# Patient Record
Sex: Male | Born: 1935 | Race: White | Hispanic: No | Marital: Married | Smoking: Never smoker
Health system: Southern US, Community
[De-identification: ages and names within clinical notes are randomized; demographics above are authoritative.]

## PROBLEM LIST (undated history)

## (undated) DIAGNOSIS — F0390 Unspecified dementia without behavioral disturbance: Secondary | ICD-10-CM

## (undated) DIAGNOSIS — E538 Deficiency of other specified B group vitamins: Secondary | ICD-10-CM

## (undated) DIAGNOSIS — E669 Obesity, unspecified: Secondary | ICD-10-CM

## (undated) DIAGNOSIS — F329 Major depressive disorder, single episode, unspecified: Secondary | ICD-10-CM

## (undated) DIAGNOSIS — I10 Essential (primary) hypertension: Secondary | ICD-10-CM

## (undated) DIAGNOSIS — I2510 Atherosclerotic heart disease of native coronary artery without angina pectoris: Secondary | ICD-10-CM

## (undated) DIAGNOSIS — I219 Acute myocardial infarction, unspecified: Secondary | ICD-10-CM

## (undated) DIAGNOSIS — E785 Hyperlipidemia, unspecified: Secondary | ICD-10-CM

## (undated) DIAGNOSIS — I2589 Other forms of chronic ischemic heart disease: Secondary | ICD-10-CM

## (undated) DIAGNOSIS — F32A Depression, unspecified: Secondary | ICD-10-CM

## (undated) HISTORY — DX: Hyperlipidemia, unspecified: E78.5

## (undated) HISTORY — DX: Deficiency of other specified B group vitamins: E53.8

## (undated) HISTORY — DX: Acute myocardial infarction, unspecified: I21.9

## (undated) HISTORY — DX: Obesity, unspecified: E66.9

## (undated) HISTORY — DX: Atherosclerotic heart disease of native coronary artery without angina pectoris: I25.10

## (undated) HISTORY — PX: FRACTURE SURGERY: SHX138

## (undated) HISTORY — DX: Essential (primary) hypertension: I10

## (undated) HISTORY — DX: Other forms of chronic ischemic heart disease: I25.89

## (undated) HISTORY — PX: CHOLECYSTECTOMY: SHX55

---
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
## 2003-09-03 ENCOUNTER — Inpatient Hospital Stay (HOSPITAL_COMMUNITY): Admission: RE | Admit: 2003-09-03 | Discharge: 2003-09-08 | Payer: Self-pay

## 2003-09-03 ENCOUNTER — Encounter (INDEPENDENT_AMBULATORY_CARE_PROVIDER_SITE_OTHER): Payer: Self-pay | Admitting: *Deleted

## 2003-09-11 ENCOUNTER — Encounter: Admission: RE | Admit: 2003-09-11 | Discharge: 2003-09-11 | Payer: Self-pay

## 2005-02-28 DIAGNOSIS — I219 Acute myocardial infarction, unspecified: Secondary | ICD-10-CM

## 2005-02-28 DIAGNOSIS — I2510 Atherosclerotic heart disease of native coronary artery without angina pectoris: Secondary | ICD-10-CM

## 2005-02-28 HISTORY — DX: Atherosclerotic heart disease of native coronary artery without angina pectoris: I25.10

## 2005-02-28 HISTORY — PX: CARDIAC CATHETERIZATION: SHX172

## 2005-02-28 HISTORY — PX: CORONARY ARTERY BYPASS GRAFT: SHX141

## 2005-02-28 HISTORY — DX: Acute myocardial infarction, unspecified: I21.9

## 2005-03-25 ENCOUNTER — Inpatient Hospital Stay (HOSPITAL_COMMUNITY): Admission: EM | Admit: 2005-03-25 | Discharge: 2005-04-05 | Payer: Self-pay | Admitting: Emergency Medicine

## 2005-03-25 ENCOUNTER — Ambulatory Visit: Payer: Self-pay | Admitting: Internal Medicine

## 2005-03-28 ENCOUNTER — Encounter: Payer: Self-pay | Admitting: Cardiology

## 2005-04-07 ENCOUNTER — Ambulatory Visit: Payer: Self-pay | Admitting: *Deleted

## 2005-04-14 ENCOUNTER — Ambulatory Visit: Payer: Self-pay | Admitting: Cardiology

## 2005-04-19 ENCOUNTER — Ambulatory Visit: Payer: Self-pay | Admitting: Cardiovascular Disease

## 2005-04-21 ENCOUNTER — Ambulatory Visit: Payer: Self-pay | Admitting: Cardiology

## 2005-04-28 ENCOUNTER — Ambulatory Visit: Payer: Self-pay | Admitting: Cardiology

## 2005-05-04 ENCOUNTER — Ambulatory Visit: Payer: Self-pay | Admitting: Internal Medicine

## 2005-05-05 ENCOUNTER — Ambulatory Visit: Payer: Self-pay | Admitting: Cardiology

## 2005-05-13 ENCOUNTER — Ambulatory Visit: Payer: Self-pay | Admitting: Cardiovascular Disease

## 2005-05-23 ENCOUNTER — Ambulatory Visit: Payer: Self-pay | Admitting: Internal Medicine

## 2005-05-27 ENCOUNTER — Ambulatory Visit: Payer: Self-pay | Admitting: Cardiology

## 2005-05-31 ENCOUNTER — Ambulatory Visit: Payer: Self-pay | Admitting: Cardiology

## 2005-06-08 ENCOUNTER — Ambulatory Visit: Payer: Self-pay | Admitting: Internal Medicine

## 2005-06-08 ENCOUNTER — Ambulatory Visit: Payer: Self-pay | Admitting: Cardiology

## 2005-06-08 ENCOUNTER — Ambulatory Visit: Payer: Self-pay | Admitting: *Deleted

## 2005-06-15 ENCOUNTER — Ambulatory Visit: Payer: Self-pay | Admitting: *Deleted

## 2005-06-17 ENCOUNTER — Ambulatory Visit: Payer: Self-pay

## 2005-06-17 ENCOUNTER — Encounter: Payer: Self-pay | Admitting: Internal Medicine

## 2005-06-29 ENCOUNTER — Ambulatory Visit: Payer: Self-pay | Admitting: Internal Medicine

## 2005-06-29 ENCOUNTER — Ambulatory Visit: Payer: Self-pay | Admitting: Cardiology

## 2005-07-11 ENCOUNTER — Ambulatory Visit: Payer: Self-pay | Admitting: Internal Medicine

## 2005-07-13 ENCOUNTER — Ambulatory Visit: Payer: Self-pay | Admitting: Cardiology

## 2005-08-01 ENCOUNTER — Ambulatory Visit: Payer: Self-pay | Admitting: Cardiology

## 2005-08-01 ENCOUNTER — Ambulatory Visit: Payer: Self-pay | Admitting: Internal Medicine

## 2005-08-29 ENCOUNTER — Ambulatory Visit: Payer: Self-pay | Admitting: Cardiology

## 2005-09-12 ENCOUNTER — Ambulatory Visit: Payer: Self-pay | Admitting: Internal Medicine

## 2005-09-15 ENCOUNTER — Ambulatory Visit: Payer: Self-pay | Admitting: Cardiology

## 2005-09-26 ENCOUNTER — Ambulatory Visit: Payer: Self-pay | Admitting: Cardiology

## 2005-10-19 ENCOUNTER — Encounter: Payer: Self-pay | Admitting: Cardiology

## 2005-10-19 ENCOUNTER — Ambulatory Visit: Payer: Self-pay

## 2005-10-19 ENCOUNTER — Ambulatory Visit: Payer: Self-pay | Admitting: Cardiology

## 2005-12-14 ENCOUNTER — Ambulatory Visit: Payer: Self-pay | Admitting: Internal Medicine

## 2006-03-16 ENCOUNTER — Ambulatory Visit: Payer: Self-pay | Admitting: Internal Medicine

## 2006-03-17 ENCOUNTER — Ambulatory Visit: Payer: Self-pay | Admitting: Internal Medicine

## 2006-05-25 ENCOUNTER — Ambulatory Visit: Payer: Self-pay | Admitting: Cardiology

## 2006-07-05 ENCOUNTER — Ambulatory Visit: Payer: Self-pay | Admitting: Cardiology

## 2006-07-05 LAB — CONVERTED CEMR LAB
ALT: 16 units/L (ref 0–40)
AST: 21 units/L (ref 0–37)
Albumin: 3.5 g/dL (ref 3.5–5.2)
Alkaline Phosphatase: 76 units/L (ref 39–117)
Bilirubin, Direct: 0.1 mg/dL (ref 0.0–0.3)
Cholesterol: 131 mg/dL (ref 0–200)
HDL: 37 mg/dL — ABNORMAL LOW (ref >39.0)
LDL Cholesterol: 73 mg/dL (ref 0–99)
Total Bilirubin: 0.7 mg/dL (ref 0.3–1.2)
Total CHOL/HDL Ratio: 3.5
Total Protein: 6.8 g/dL (ref 6.0–8.3)
Triglycerides: 104 mg/dL (ref 0–149)
VLDL: 21 mg/dL (ref 0–40)

## 2007-01-04 ENCOUNTER — Encounter: Payer: Self-pay | Admitting: Internal Medicine

## 2007-01-04 ENCOUNTER — Ambulatory Visit: Payer: Self-pay | Admitting: Cardiology

## 2007-01-13 ENCOUNTER — Encounter: Payer: Self-pay | Admitting: *Deleted

## 2007-01-13 DIAGNOSIS — I10 Essential (primary) hypertension: Secondary | ICD-10-CM

## 2007-01-13 DIAGNOSIS — I219 Acute myocardial infarction, unspecified: Secondary | ICD-10-CM | POA: Insufficient documentation

## 2007-01-13 DIAGNOSIS — Z951 Presence of aortocoronary bypass graft: Secondary | ICD-10-CM

## 2007-01-13 DIAGNOSIS — Z9089 Acquired absence of other organs: Secondary | ICD-10-CM

## 2007-01-13 DIAGNOSIS — I2510 Atherosclerotic heart disease of native coronary artery without angina pectoris: Secondary | ICD-10-CM | POA: Insufficient documentation

## 2007-01-13 DIAGNOSIS — I2589 Other forms of chronic ischemic heart disease: Secondary | ICD-10-CM | POA: Insufficient documentation

## 2007-01-13 DIAGNOSIS — Z9861 Coronary angioplasty status: Secondary | ICD-10-CM

## 2007-01-13 DIAGNOSIS — E785 Hyperlipidemia, unspecified: Secondary | ICD-10-CM | POA: Insufficient documentation

## 2007-03-15 ENCOUNTER — Encounter: Payer: Self-pay | Admitting: Internal Medicine

## 2007-03-21 ENCOUNTER — Encounter: Payer: Self-pay | Admitting: Internal Medicine

## 2007-06-27 ENCOUNTER — Ambulatory Visit: Payer: Self-pay | Admitting: Internal Medicine

## 2007-06-27 ENCOUNTER — Encounter (INDEPENDENT_AMBULATORY_CARE_PROVIDER_SITE_OTHER): Payer: Self-pay | Admitting: *Deleted

## 2007-06-27 DIAGNOSIS — E669 Obesity, unspecified: Secondary | ICD-10-CM

## 2007-07-02 ENCOUNTER — Ambulatory Visit: Payer: Self-pay | Admitting: Internal Medicine

## 2007-07-02 LAB — CONVERTED CEMR LAB
ALT: 12 units/L (ref 0–53)
AST: 22 units/L (ref 0–37)
Albumin: 3.6 g/dL (ref 3.5–5.2)
Alkaline Phosphatase: 70 units/L (ref 39–117)
BUN: 13 mg/dL (ref 6–23)
Basophils Absolute: 0.1 10*3/uL (ref 0.0–0.1)
Basophils Relative: 1 % (ref 0.0–1.0)
Bilirubin, Direct: 0.1 mg/dL (ref 0.0–0.3)
CO2: 31 meq/L (ref 19–32)
Calcium: 8.6 mg/dL (ref 8.4–10.5)
Chloride: 108 meq/L (ref 96–112)
Cholesterol: 156 mg/dL (ref 0–200)
Creatinine, Ser: 1 mg/dL (ref 0.4–1.5)
Eosinophils Absolute: 0.2 10*3/uL (ref 0.0–0.7)
Eosinophils Relative: 2.7 % (ref 0.0–5.0)
GFR calc Af Amer: 94 mL/min
GFR calc non Af Amer: 78 mL/min
Glucose, Bld: 82 mg/dL (ref 70–99)
HCT: 38.7 % — ABNORMAL LOW (ref 39.0–52.0)
HDL: 34.8 mg/dL — ABNORMAL LOW (ref >39.0)
Hemoglobin: 12.8 g/dL — ABNORMAL LOW (ref 13.0–17.0)
Hgb A1c MFr Bld: 5.9 % (ref 4.6–6.0)
LDL Cholesterol: 102 mg/dL — ABNORMAL HIGH (ref 0–99)
Lymphocytes Relative: 15.4 % (ref 12.0–46.0)
MCHC: 33.1 g/dL (ref 30.0–36.0)
MCV: 87.4 fL (ref 78.0–100.0)
Monocytes Absolute: 0.7 10*3/uL (ref 0.1–1.0)
Monocytes Relative: 9.1 % (ref 3.0–12.0)
Neutro Abs: 5.2 10*3/uL (ref 1.4–7.7)
Neutrophils Relative %: 71.8 % (ref 43.0–77.0)
Platelets: 170 10*3/uL (ref 150–400)
Potassium: 4 meq/L (ref 3.5–5.1)
RBC: 4.43 M/uL (ref 4.22–5.81)
RDW: 13.1 % (ref 11.5–14.6)
Sodium: 146 meq/L — ABNORMAL HIGH (ref 135–145)
TSH: 2.71 microintl units/mL (ref 0.35–5.50)
Total Bilirubin: 0.9 mg/dL (ref 0.3–1.2)
Total CHOL/HDL Ratio: 4.5
Total Protein: 6.8 g/dL (ref 6.0–8.3)
Triglycerides: 95 mg/dL (ref 0–149)
VLDL: 19 mg/dL (ref 0–40)
WBC: 7.3 10*3/uL (ref 4.5–10.5)

## 2007-08-01 ENCOUNTER — Ambulatory Visit: Payer: Self-pay | Admitting: Cardiology

## 2007-09-06 ENCOUNTER — Encounter: Payer: Self-pay | Admitting: Internal Medicine

## 2008-06-09 ENCOUNTER — Ambulatory Visit: Payer: Self-pay | Admitting: Internal Medicine

## 2008-06-09 LAB — CONVERTED CEMR LAB
ALT: 13 units/L (ref 0–53)
AST: 20 units/L (ref 0–37)
Albumin: 3.9 g/dL (ref 3.5–5.2)
Alkaline Phosphatase: 77 units/L (ref 39–117)
BUN: 13 mg/dL (ref 6–23)
Basophils Absolute: 0 10*3/uL (ref 0.0–0.1)
Basophils Relative: 0.2 % (ref 0.0–3.0)
Bilirubin, Direct: 0.1 mg/dL (ref 0.0–0.3)
CO2: 33 meq/L — ABNORMAL HIGH (ref 19–32)
Calcium: 8.9 mg/dL (ref 8.4–10.5)
Chloride: 106 meq/L (ref 96–112)
Cholesterol: 134 mg/dL (ref 0–200)
Creatinine, Ser: 1.2 mg/dL (ref 0.4–1.5)
Eosinophils Absolute: 0.2 10*3/uL (ref 0.0–0.7)
Eosinophils Relative: 3.2 % (ref 0.0–5.0)
GFR calc non Af Amer: 63.02 mL/min (ref >60)
Glucose, Bld: 75 mg/dL (ref 70–99)
HCT: 41.2 % (ref 39.0–52.0)
HDL: 37.7 mg/dL — ABNORMAL LOW (ref >39.00)
Hemoglobin: 13.9 g/dL (ref 13.0–17.0)
LDL Cholesterol: 78 mg/dL (ref 0–99)
Lymphocytes Relative: 22.5 % (ref 12.0–46.0)
Lymphs Abs: 1.4 10*3/uL (ref 0.7–4.0)
MCHC: 33.8 g/dL (ref 30.0–36.0)
MCV: 87.3 fL (ref 78.0–100.0)
Monocytes Absolute: 0.7 10*3/uL (ref 0.1–1.0)
Monocytes Relative: 11.9 % (ref 3.0–12.0)
Neutro Abs: 3.9 10*3/uL (ref 1.4–7.7)
Neutrophils Relative %: 62.2 % (ref 43.0–77.0)
Platelets: 163 10*3/uL (ref 150.0–400.0)
Potassium: 4.5 meq/L (ref 3.5–5.1)
RBC: 4.73 M/uL (ref 4.22–5.81)
RDW: 12.7 % (ref 11.5–14.6)
Sodium: 145 meq/L (ref 135–145)
TSH: 2.08 microintl units/mL (ref 0.35–5.50)
Total Bilirubin: 0.6 mg/dL (ref 0.3–1.2)
Total CHOL/HDL Ratio: 4
Total Protein: 7.5 g/dL (ref 6.0–8.3)
Triglycerides: 91 mg/dL (ref 0.0–149.0)
VLDL: 18.2 mg/dL (ref 0.0–40.0)
WBC: 6.2 10*3/uL (ref 4.5–10.5)

## 2008-06-10 ENCOUNTER — Encounter: Payer: Self-pay | Admitting: Internal Medicine

## 2008-08-01 ENCOUNTER — Ambulatory Visit: Payer: Self-pay | Admitting: Cardiology

## 2008-08-07 ENCOUNTER — Telehealth (INDEPENDENT_AMBULATORY_CARE_PROVIDER_SITE_OTHER): Payer: Self-pay | Admitting: Radiology

## 2008-08-11 ENCOUNTER — Ambulatory Visit: Payer: Self-pay | Admitting: Cardiology

## 2008-08-11 ENCOUNTER — Ambulatory Visit: Payer: Self-pay

## 2008-08-12 ENCOUNTER — Encounter: Payer: Self-pay | Admitting: Cardiology

## 2008-08-19 LAB — CONVERTED CEMR LAB
ALT: 9 units/L (ref 0–53)
AST: 21 units/L (ref 0–37)
Albumin: 3.6 g/dL (ref 3.5–5.2)
Alkaline Phosphatase: 79 units/L (ref 39–117)
Bilirubin, Direct: 0.1 mg/dL (ref 0.0–0.3)
Cholesterol: 126 mg/dL (ref 0–200)
HDL: 34.5 mg/dL — ABNORMAL LOW (ref >39.00)
LDL Cholesterol: 72 mg/dL (ref 0–99)
Total Bilirubin: 0.8 mg/dL (ref 0.3–1.2)
Total CHOL/HDL Ratio: 4
Total Protein: 7.1 g/dL (ref 6.0–8.3)
Triglycerides: 96 mg/dL (ref 0.0–149.0)
VLDL: 19.2 mg/dL (ref 0.0–40.0)

## 2008-08-21 ENCOUNTER — Ambulatory Visit: Payer: Self-pay | Admitting: Cardiology

## 2008-08-25 ENCOUNTER — Telehealth: Payer: Self-pay | Admitting: Cardiology

## 2008-08-27 ENCOUNTER — Encounter: Payer: Self-pay | Admitting: Cardiology

## 2008-08-27 ENCOUNTER — Encounter (INDEPENDENT_AMBULATORY_CARE_PROVIDER_SITE_OTHER): Payer: Self-pay

## 2008-09-03 ENCOUNTER — Ambulatory Visit (HOSPITAL_COMMUNITY): Admission: RE | Admit: 2008-09-03 | Discharge: 2008-09-03 | Payer: Self-pay | Admitting: Cardiology

## 2008-09-03 ENCOUNTER — Ambulatory Visit: Payer: Self-pay | Admitting: Cardiology

## 2008-09-22 ENCOUNTER — Ambulatory Visit: Payer: Self-pay | Admitting: Cardiology

## 2008-12-03 ENCOUNTER — Telehealth: Payer: Self-pay | Admitting: Internal Medicine

## 2008-12-11 ENCOUNTER — Ambulatory Visit: Payer: Self-pay | Admitting: Internal Medicine

## 2009-03-27 ENCOUNTER — Ambulatory Visit: Payer: Self-pay | Admitting: Cardiology

## 2009-06-11 ENCOUNTER — Ambulatory Visit: Payer: Self-pay | Admitting: Internal Medicine

## 2009-06-12 LAB — CONVERTED CEMR LAB
ALT: 13 units/L (ref 0–53)
AST: 20 units/L (ref 0–37)
Albumin: 3.9 g/dL (ref 3.5–5.2)
Alkaline Phosphatase: 80 units/L (ref 39–117)
BUN: 10 mg/dL (ref 6–23)
Bilirubin, Direct: 0.2 mg/dL (ref 0.0–0.3)
CO2: 31 meq/L (ref 19–32)
Calcium: 8.8 mg/dL (ref 8.4–10.5)
Chloride: 103 meq/L (ref 96–112)
Cholesterol: 136 mg/dL (ref 0–200)
Creatinine, Ser: 1.1 mg/dL (ref 0.4–1.5)
GFR calc non Af Amer: 69.49 mL/min (ref >60)
Glucose, Bld: 86 mg/dL (ref 70–99)
HDL: 44.2 mg/dL (ref >39.00)
LDL Cholesterol: 78 mg/dL (ref 0–99)
Potassium: 4.5 meq/L (ref 3.5–5.1)
Sodium: 142 meq/L (ref 135–145)
TSH: 1.74 microintl units/mL (ref 0.35–5.50)
Total Bilirubin: 0.7 mg/dL (ref 0.3–1.2)
Total CHOL/HDL Ratio: 3
Total Protein: 7.5 g/dL (ref 6.0–8.3)
Triglycerides: 69 mg/dL (ref 0.0–149.0)
VLDL: 13.8 mg/dL (ref 0.0–40.0)

## 2009-09-01 ENCOUNTER — Ambulatory Visit: Payer: Self-pay | Admitting: Cardiology

## 2009-09-01 DIAGNOSIS — I2510 Atherosclerotic heart disease of native coronary artery without angina pectoris: Secondary | ICD-10-CM

## 2009-09-04 LAB — CONVERTED CEMR LAB
ALT: 13 units/L (ref 0–53)
AST: 19 units/L (ref 0–37)
Albumin: 3.9 g/dL (ref 3.5–5.2)
Alkaline Phosphatase: 86 units/L (ref 39–117)
Bilirubin, Direct: 0.2 mg/dL (ref 0.0–0.3)
Cholesterol: 131 mg/dL (ref 0–200)
HDL: 41.4 mg/dL (ref >39.00)
LDL Cholesterol: 73 mg/dL (ref 0–99)
Total Bilirubin: 0.4 mg/dL (ref 0.3–1.2)
Total CHOL/HDL Ratio: 3
Total Protein: 7.4 g/dL (ref 6.0–8.3)
Triglycerides: 81 mg/dL (ref 0.0–149.0)
VLDL: 16.2 mg/dL (ref 0.0–40.0)

## 2009-09-08 ENCOUNTER — Ambulatory Visit (HOSPITAL_COMMUNITY): Admission: RE | Admit: 2009-09-08 | Discharge: 2009-09-08 | Payer: Self-pay | Admitting: Cardiology

## 2009-09-08 ENCOUNTER — Encounter: Payer: Self-pay | Admitting: Cardiology

## 2009-09-08 ENCOUNTER — Ambulatory Visit: Payer: Self-pay

## 2009-09-08 ENCOUNTER — Ambulatory Visit: Payer: Self-pay | Admitting: Cardiology

## 2009-12-10 ENCOUNTER — Ambulatory Visit: Payer: Self-pay | Admitting: Internal Medicine

## 2009-12-10 DIAGNOSIS — E538 Deficiency of other specified B group vitamins: Secondary | ICD-10-CM | POA: Insufficient documentation

## 2009-12-10 LAB — CONVERTED CEMR LAB
Folate: 8.4 ng/mL
TSH: 1.86 microintl units/mL (ref 0.35–5.50)
Vitamin B-12: 164 pg/mL — ABNORMAL LOW (ref 211–911)

## 2009-12-15 ENCOUNTER — Ambulatory Visit: Payer: Self-pay | Admitting: Internal Medicine

## 2009-12-16 ENCOUNTER — Ambulatory Visit (HOSPITAL_COMMUNITY): Admission: RE | Admit: 2009-12-16 | Discharge: 2009-12-16 | Payer: Self-pay | Admitting: Internal Medicine

## 2009-12-23 ENCOUNTER — Ambulatory Visit: Payer: Self-pay | Admitting: Internal Medicine

## 2009-12-30 ENCOUNTER — Ambulatory Visit: Payer: Self-pay | Admitting: Internal Medicine

## 2010-01-06 ENCOUNTER — Ambulatory Visit: Payer: Self-pay | Admitting: Internal Medicine

## 2010-02-03 ENCOUNTER — Ambulatory Visit: Payer: Self-pay | Admitting: Internal Medicine

## 2010-03-08 ENCOUNTER — Ambulatory Visit
Admission: RE | Admit: 2010-03-08 | Discharge: 2010-03-08 | Payer: Self-pay | Source: Home / Self Care | Attending: Internal Medicine | Admitting: Internal Medicine

## 2010-03-11 ENCOUNTER — Ambulatory Visit
Admission: RE | Admit: 2010-03-11 | Discharge: 2010-03-11 | Payer: Self-pay | Source: Home / Self Care | Attending: Internal Medicine | Admitting: Internal Medicine

## 2010-03-11 DIAGNOSIS — F0391 Unspecified dementia with behavioral disturbance: Secondary | ICD-10-CM | POA: Insufficient documentation

## 2010-03-31 NOTE — Assessment & Plan Note (Signed)
Summary: PER PT 1 WK B12--VL---STC  Nurse Visit   Allergies: No Known Drug Allergies  Medication Administration  Injection # 1:    Medication: Vit B12 1000 mcg    Diagnosis: VITAMIN B12 DEFICIENCY (ICD-266.2)    Route: IM    Site: R deltoid    Exp Date: 05/30/2011    Lot #: [id number]    Mfr: [employer] Regent    Patient tolerated injection without complications    Given by: Margaret Pyle, CMA (January 06, 2010 8:31 AM)  Orders Added: 1)  Admin of Therapeutic Inj  intramuscular or subcutaneous [96372] 2)  Vit B12 1000 mcg [J3420]

## 2010-03-31 NOTE — Assessment & Plan Note (Signed)
Summary: b12 shot/leschber/cd  Nurse Visit   Allergies: No Known Drug Allergies  Medication Administration  Injection # 1:    Medication: Vit B12 1000 mcg    Diagnosis: VITAMIN B12 DEFICIENCY (ICD-266.2)    Route: IM    Site: L deltoid    Exp Date: 09/29/2011    Lot #: [id number]    Mfr: [employer] Regent    Patient tolerated injection without complications    Given by: Margaret Pyle, CMA (February 03, 2010 8:29 AM)  Orders Added: 1)  Admin of Therapeutic Inj  intramuscular or subcutaneous [96372] 2)  Vit B12 1000 mcg [J3420]

## 2010-03-31 NOTE — Assessment & Plan Note (Signed)
Summary: b12 shot/val//cd  Nurse Visit   Allergies: No Known Drug Allergies  Medication Administration  Injection # 1:    Medication: Vit B12 1000 mcg    Diagnosis: VITAMIN B12 DEFICIENCY (ICD-266.2)    Route: IM    Site: L deltoid    Exp Date: 07/30/2011    Lot #: [id number]    Mfr: [employer] Regent    Patient tolerated injection without complications    Given by: Margaret Pyle, CMA (December 30, 2009 8:44 AM)  Orders Added: 1)  Admin of Therapeutic Inj  intramuscular or subcutaneous [96372] 2)  Vit B12 1000 mcg [J3420]

## 2010-03-31 NOTE — Assessment & Plan Note (Signed)
Summary: PER LUCY  B12 INJ  VL  STC  Nurse Visit   Allergies: No Known Drug Allergies  Medication Administration  Injection # 1:    Medication: Vit B12 1000 mcg    Diagnosis: VITAMIN B12 DEFICIENCY (ICD-266.2)    Route: IM    Site: L deltoid    Exp Date: 08/29/2011    Lot #: [id number]    Mfr: [employer] Regent    Patient tolerated injection without complications    Given by: Margaret Pyle, CMA (December 15, 2009 8:56 AM)  Orders Added: 1)  Admin of Therapeutic Inj  intramuscular or subcutaneous [96372] 2)  Vit B12 1000 mcg [J3420]

## 2010-03-31 NOTE — Assessment & Plan Note (Signed)
Summary: 6 MTH FU  STC   Vital Signs:  Patient profile:   77 year old male Height:      71 inches (180.34 cm) Weight:      236.0 pounds (107.27 kg) O2 Sat:      95 % on Room air Temp:     97.0 degrees F (36.11 degrees C) oral Pulse rate:   44 / minute BP sitting:   132 / 72  (left arm) Cuff size:   regular  Vitals Entered By: Orlan Leavens (June 11, 2009 10:38 AM)  O2 Flow:  Room air CC: 6 month follow-up Is Patient Diabetic? No Pain Assessment Patient in pain? no        Primary Care Provider:  Newt Lukes MD  CC:  6 month follow-up.  History of Present Illness: here for 6 month f/u  HTN - doing well no problems with current meds - needs refills  no CP, HA, LE swelling or SOB; no cough  dylipidemia -  no problems on current meds no muscle pains or GI upset  CAD - repeat cath 7/10 by dr. Riley Kill - no critical dz found s/p CABG following MI 1/07 no recurrent symptoms or active angina  Clinical Review Panels:  Immunizations   Last Tetanus Booster:  Historical (07/29/2008)  Lipid Management   Cholesterol:  126 (08/11/2008)   LDL (bad choesterol):  72 (08/11/2008)   HDL (good cholesterol):  34.50 (08/11/2008)  CBC   WBC:  6.2 (06/09/2008)   RBC:  4.73 (06/09/2008)   Hgb:  13.9 (06/09/2008)   Hct:  41.2 (06/09/2008)   Platelets:  163.0 (06/09/2008)   MCV  87.3 (06/09/2008)   MCHC  33.8 (06/09/2008)   RDW  12.7 (06/09/2008)   PMN:  62.2 (06/09/2008)   Lymphs:  22.5 (06/09/2008)   Monos:  11.9 (06/09/2008)   Eosinophils:  3.2 (06/09/2008)   Basophil:  0.2 (06/09/2008)  Complete Metabolic Panel   Glucose:  75 (06/09/2008)   Sodium:  145 (06/09/2008)   Potassium:  4.5 (06/09/2008)   Chloride:  106 (06/09/2008)   CO2:  33 (06/09/2008)   BUN:  13 (06/09/2008)   Creatinine:  1.2 (06/09/2008)   Albumin:  3.6 (08/11/2008)   Total Protein:  7.1 (08/11/2008)   Calcium:  8.9 (06/09/2008)   Total Bili:  0.8 (08/11/2008)   Alk Phos:  79  (08/11/2008)   SGPT (ALT):  9 (08/11/2008)   SGOT (AST):  21 (08/11/2008)   Current Medications (verified): 1)  Altace 2.5 Mg  Caps (Ramipril) .... Take 1 Tablet By Mouth Once A Day 2)  Simvastatin 40 Mg  Tabs (Simvastatin) .... Take 1 Tablet By Mouth Once A Day At Bedtime 3)  Aspirin 81 Mg  Tabs (Aspirin) .... Take 1 Tablet By Mouth Once A Day 4)  Metoprolol Tartrate 25 Mg Tabs (Metoprolol Tartrate) .... Take 1 Tablet By Mouth Twice A Day  Allergies (verified): No Known Drug Allergies  Past History:  Past Medical History: * Hx of SMALL APICAL THROMBUS * PATIENT IN HORIZON STUDY HYPERTENSION  DYSLIPIDEMIA  CAD s/p MI with 3v CABG 02/2005   MD rooster: cards - stuckey  Review of Systems  The patient denies weight loss, chest pain, syncope, peripheral edema, and abdominal pain.    Physical Exam  General:  alert, well-developed, well-nourished, and cooperative to examination.    Lungs:  normal respiratory effort, no intercostal retractions or use of accessory muscles; normal breath sounds bilaterally - no crackles and no  wheezes.    Heart:  normal rate, regular rhythm, no murmur, and no rub. BLE without edema. Psych:  simple and pleasant man, Oriented X3, memory intact for recent and remote, normally interactive, good eye contact, not anxious appearing, and not depressed appearing.     Impression & Recommendations:  Problem # 1:  HYPERTENSION (ICD-401.9)  His updated medication list for this problem includes:    Altace 2.5 Mg Caps (Ramipril) .Marland Kitchen... Take 1 tablet by mouth once a day    Metoprolol Tartrate 25 Mg Tabs (Metoprolol tartrate) .Marland Kitchen... Take 1 tablet by mouth twice a day  Orders: TLB-BMP (Basic Metabolic Panel-BMET) (80048-METABOL)  BP today: 132/72 Prior BP: 140/80 (03/27/2009)  Labs Reviewed: K+: 4.5 (06/09/2008) Creat: : 1.2 (06/09/2008)   Chol: 126 (08/11/2008)   HDL: 34.50 (08/11/2008)   LDL: 72 (08/11/2008)   TG: 96.0 (08/11/2008)  Problem # 2:   DYSLIPIDEMIA (ICD-272.4)  His updated medication list for this problem includes:    Simvastatin 40 Mg Tabs (Simvastatin) .Marland Kitchen... Take 1 tablet by mouth once a day at bedtime  Orders: TLB-Lipid Panel (80061-LIPID)  Labs Reviewed: SGOT: 21 (08/11/2008)   SGPT: 9 (08/11/2008)   HDL:34.50 (08/11/2008), 37.70 (06/09/2008)  LDL:72 (08/11/2008), 78 (06/09/2008)  Chol:126 (08/11/2008), 134 (06/09/2008)  Trig:96.0 (08/11/2008), 91.0 (06/09/2008)  Problem # 3:  CORONARY ARTERY DISEASE (ICD-414.00)  His updated medication list for this problem includes:    Altace 2.5 Mg Caps (Ramipril) .Marland Kitchen... Take 1 tablet by mouth once a day    Aspirin 81 Mg Tabs (Aspirin) .Marland Kitchen... Take 1 tablet by mouth once a day    Metoprolol Tartrate 25 Mg Tabs (Metoprolol tartrate) .Marland Kitchen... Take 1 tablet by mouth twice a day  no active symptoms, last cath report reviewed cont same - f/u with dr. Riley Kill as scheduled also remians on study drug - per cards  Labs Reviewed: Chol: 126 (08/11/2008)   HDL: 34.50 (08/11/2008)   LDL: 72 (08/11/2008)   TG: 96.0 (08/11/2008)  Complete Medication List: 1)  Altace 2.5 Mg Caps (Ramipril) .... Take 1 tablet by mouth once a day 2)  Simvastatin 40 Mg Tabs (Simvastatin) .... Take 1 tablet by mouth once a day at bedtime 3)  Aspirin 81 Mg Tabs (Aspirin) .... Take 1 tablet by mouth once a day 4)  Metoprolol Tartrate 25 Mg Tabs (Metoprolol tartrate) .... Take 1 tablet by mouth twice a day  Other Orders: TLB-Hepatic/Liver Function Pnl (80076-HEPATIC) TLB-TSH (Thyroid Stimulating Hormone) (16109-UEA)  Patient Instructions: 1)  it was good to see you today.  2)  test(s) ordered today - your results will be called to you at home in 48-72 hours from the time of test completion - will leave message on answering machine as discussed if you are not home- 3)  no medication changes - keep doing the same thing - refills provided 4)  Please schedule a follow-up appointment in 6 months, sooner if problems.   Prescriptions: METOPROLOL TARTRATE 25 MG TABS (METOPROLOL TARTRATE) Take 1 tablet by mouth twice a day  #60 x 11   Entered and Authorized by:   Newt Lukes MD   Signed by:   Newt Lukes MD on 06/11/2009   Method used:   Electronically to        [facility]  [address] [phone]* (retail)       [address]       [place], [place]  [address]  Ph: [phone] or [phone]       Fax: [phone]   RxID:   [id number] SIMVASTATIN 40 MG  TABS (SIMVASTATIN) Take 1 tablet by mouth once a day at bedtime  #30 x 11   Entered and Authorized by:   Newt Lukes MD   Signed by:   Newt Lukes MD on 06/11/2009   Method used:   [facility]ronically to        [facility]  [address] [phone]* (retail)       [address]       [place], [place]  [address]       Ph: [phone] or [phone]       Fax: [phone]   RxID:   [id number] ALTACE 2.5 MG  CAPS (RAMIPRIL) Take 1 tablet by mouth once a day  #30 x 11   Entered and Authorized by:   Newt Lukes MD   Signed by:   Newt Lukes MD on 06/11/2009   Method used:   [facility]ically to        [facility]  [address] [phone]* (retail)       [address]       [place], [place]  [id number]       Ph: [phone] or [phone]       Fax: [phone]   RxID:   [id number]

## 2010-03-31 NOTE — Assessment & Plan Note (Signed)
Summary: Jacob Costa   Visit Type:  6 months follow up Primary Provider:  Newt Lukes MD   History of Present Illness: bought a new house and moved.  No chest pain.  Overall doing well. Denies any shortness of breath, or other symptoms.  Has a stiff neck in his r neck.  Says he can run rings around the young guys.  Class I symptoms.   Current Medications (verified): 1)  Altace 2.5 Mg  Caps (Ramipril) .... Take 1 Tablet By Mouth Once A Day 2)  Simvastatin 40 Mg  Tabs (Simvastatin) .... Take 1 Tablet By Mouth Once A Day At Bedtime 3)  Aspirin 81 Mg  Tabs (Aspirin) .... Take 1 Tablet By Mouth Once A Day 4)  Metoprolol Tartrate 25 Mg Tabs (Metoprolol Tartrate) .... Take 1 Tablet By Mouth Twice A Day  Allergies (verified): No Known Drug Allergies  Vital Signs:  Patient profile:   77 year old male Height:      71 inches Weight:      230.50 pounds BMI:     32.26 Pulse rate:   53 / minute Pulse rhythm:   irregular Resp:     18 per minute BP sitting:   140 / 80  (left arm) Cuff size:   large  Vitals Entered By: Vikki Ports (March 27, 2009 10:12 AM)  Physical Exam  General:  Well developed, well nourished, in no acute distress. Head:  normocephalic and atraumatic Neck:  No obvious JVD Lungs:  Clear bilaterally to auscultation and percussion. Heart:  PMI non displaced. Normal S1 and S2.  Without def murmur.  No rub or gallop. Abdomen:  Bowel sounds positive; abdomen soft and non-tender without masses, organomegaly, or hernias noted. No hepatosplenomegaly.   Pulses:  pulses normal in all 4 extremities Extremities:  No clubbing or cyanosis.  No edema. Neurologic:  Alert and oriented x 3.   Cardiac Cath  Procedure date:  09/03/2008  Findings:      CONCLUSION: 1. Continued patency of the internal mammary to the LAD, saphenous     vein graft to the intermediate, and saphenous vein graft to the OM. 2. Continued patency of the acute infarct stent in the left anterior  descending artery. 3. Moderate reduction in left ventricular function.   DISPOSITION:  Continued medical therapy will be recommended.  He will see me back in the office in followup.  EKG  Procedure date:  03/27/2009  Findings:      NSR.  LVH. Cannot exclude ASMI.  Nonspecific T abnormality with lateral biphasic T waves.    Impression & Recommendations:  Problem # 1:  CORONARY ARTERY BYPASS GRAFT, THREE VESSEL, HX OF (ICD-V45.81) remains stable. Recent cath.  See report.  Continue medical therapy.  Problem # 2:  DYSLIPIDEMIA (ICD-272.4)  At target on meds.  Will recheck in 6 months. His updated medication list for this problem includes:    Simvastatin 40 Mg Tabs (Simvastatin) .Marland Kitchen... Take 1 tablet by mouth once a day at bedtime  His updated medication list for this problem includes:    Simvastatin 40 Mg Tabs (Simvastatin) .Marland Kitchen... Take 1 tablet by mouth once a day at bedtime  Problem # 3:  ISCHEMIC CARDIOMYOPATHY (ICD-414.8)  Class I symptoms.  EF 35%.  Will recheck with echo at next ov.  He feels well without any signs by history or exam. His updated medication list for this problem includes:    Altace 2.5 Mg Caps (Ramipril) .Marland Kitchen... Take 1 tablet by  mouth once a day    Aspirin 81 Mg Tabs (Aspirin) .Marland Kitchen... Take 1 tablet by mouth once a day    Metoprolol Tartrate 25 Mg Tabs (Metoprolol tartrate) .Marland Kitchen... Take 1 tablet by mouth twice a day  Orders: EKG w/ Interpretation (93000)  Patient Instructions: 1)  Your physician wants you to follow-up in:   6 MONTHS. You will receive a reminder letter in the mail two months in advance. If you don't receive a letter, please call our office to schedule the follow-up appointment. 2)  Your physician recommends that you return for a FASTING LIPID and LIVER Profile in 6 MONTHS (414.01, 414.8, 272.0)  3)  Your physician has requested that you have an echocardiogram in 6 MONTHS.  Echocardiography is a painless test that uses sound waves to create images of  your heart. It provides your doctor with information about the size and shape of your heart and how well your heart's chambers and valves are working.  This procedure takes approximately one hour. There are no restrictions for this procedure.

## 2010-03-31 NOTE — Assessment & Plan Note (Signed)
Summary: PER PT 1 WK B12--VL--STC  Nurse Visit   Allergies: No Known Drug Allergies  Medication Administration  Injection # 1:    Medication: Vit B12 1000 mcg    Diagnosis: VITAMIN B12 DEFICIENCY (ICD-266.2)    Route: IM    Site: R deltoid    Exp Date: 08/2011    Lot #: [id number]    Mfr: [employer] Regent    Patient tolerated injection without complications    Given by: Zella Ball Ewing CMA Duncan Dull) (December 23, 2009 8:28 AM)  Orders Added: 1)  Vit B12 1000 mcg [J3420] 2)  Admin of Therapeutic Inj  intramuscular or subcutaneous [27253]

## 2010-03-31 NOTE — Assessment & Plan Note (Signed)
Summary: f35m   Visit Type:  6 months follow up Primary Provider:  Newt Lukes MD  CC:  No complains.  History of Present Illness: Jacob Costa mows his own yard with a push mower.  No chest pain.  Overall doing well.  Does not stay still too long.He does not push it, but feels good overall.    Current Medications (verified): 1)  Altace 2.5 Mg  Caps (Ramipril) .... Take 1 Tablet By Mouth Once A Day 2)  Simvastatin 40 Mg  Tabs (Simvastatin) .... Take 1 Tablet By Mouth Once A Day At Bedtime 3)  Aspirin 81 Mg  Tabs (Aspirin) .... Take 1 Tablet By Mouth Once A Day 4)  Metoprolol Tartrate 25 Mg Tabs (Metoprolol Tartrate) .... Take 1 Tablet By Mouth Twice A Day  Allergies (verified): No Known Drug Allergies  Past History:  Past Medical History: Last updated: 06/11/2009 * Hx of SMALL APICAL THROMBUS * PATIENT IN HORIZON STUDY HYPERTENSION  DYSLIPIDEMIA  CAD s/p MI with 3v CABG 02/2005   MD rooster: cards - Moiz Ryant  Vital Signs:  Patient profile:   77 year old male Height:      71 inches Weight:      225 pounds BMI:     31.49 Pulse rate:   68 / minute Pulse rhythm:   irregular Resp:     18 per minute BP sitting:   122 / 80  (left arm) Cuff size:   large  Vitals Entered By: Jacob Costa (September 08, 2009 3:51 PM)  Physical Exam  General:  Well developed, well nourished, in no acute distress. Head:  normocephalic and atraumatic Eyes:  PERRLA/EOM intact; conjunctiva and lids normal. Chest Wall:  median sternotomy well healed.  Lungs:  Clear bilaterally to auscultation and percussion. Heart:  PMI non displaced.  Normal S1 and S2.  No murmur. Abdomen:  Bowel sounds positive; abdomen soft and non-tender without masses, organomegaly, or hernias noted. No hepatosplenomegaly. Extremities:  No clubbing or cyanosis. Neurologic:  Alert and oriented x 3.   EKG  Procedure date:  09/08/2009  Findings:      NSR.  first degre av block.  LVH.  Echocardiogram  Procedure  date:  09/08/2009  Findings:      Study Conclusions            - Left ventricle: There is severe hypokinesis of the anterior wall       and the septum and the inferior wall. The cavity size was normal.       Wall thickness was normal. The estimated ejection fraction was       35%. Doppler parameters are consistent with abnormal left       ventricular relaxation (grade 1 diastolic dysfunction).     - Aortic valve: Sclerosis without stenosis. Trivial regurgitation.     - Pulmonary arteries: PA peak pressure: 35mm Hg (S).  Impression & Recommendations:  Problem # 1:  CORONARY ATHEROSCLEROSIS NATIVE CORONARY ARTERY (ICD-414.01)  Continues to do well.  Prior CABG.  No symptoms.  Moderate activity with Class I.  Has EF of 35%, nearly identical to what he has had in the past.  Little interest in consideraton of ICD.  stable medical regimen over a long period.   His updated medication list for this problem includes:    Altace 2.5 Mg Caps (Ramipril) .Marland Kitchen... Take 1 tablet by mouth once a day    Aspirin 81 Mg Tabs (Aspirin) .Marland Kitchen... Take 1 tablet by mouth once  a day    Metoprolol Tartrate 25 Mg Tabs (Metoprolol tartrate) .Marland Kitchen... Take 1 tablet by mouth twice a day    His updated medication list for this problem includes:    Altace 2.5 Mg Caps (Ramipril) .Marland Kitchen... Take 1 tablet by mouth once a day    Aspirin 81 Mg Tabs (Aspirin) .Marland Kitchen... Take 1 tablet by mouth once a day    Metoprolol Tartrate 25 Mg Tabs (Metoprolol tartrate) .Marland Kitchen... Take 1 tablet by mouth twice a day  Orders: EKG w/ Interpretation (93000)  Problem # 2:  HYPERTENSION (ICD-401.9)  Nicely controlled on medications at present.  His updated medication list for this problem includes:    Altace 2.5 Mg Caps (Ramipril) .Marland Kitchen... Take 1 tablet by mouth once a day    Aspirin 81 Mg Tabs (Aspirin) .Marland Kitchen... Take 1 tablet by mouth once a day    Metoprolol Tartrate 25 Mg Tabs (Metoprolol tartrate) .Marland Kitchen... Take 1 tablet by mouth twice a day    His updated  medication list for this problem includes:    Altace 2.5 Mg Caps (Ramipril) .Marland Kitchen... Take 1 tablet by mouth once a day    Aspirin 81 Mg Tabs (Aspirin) .Marland Kitchen... Take 1 tablet by mouth once a day    Metoprolol Tartrate 25 Mg Tabs (Metoprolol tartrate) .Marland Kitchen... Take 1 tablet by mouth twice a day  Orders: EKG w/ Interpretation (93000)  Problem # 3:  DYSLIPIDEMIA (ICD-272.4)  Numbers reveiwed today.  LDL at 73, and HDL acceptable.  LFTs ok.  No problems.  His updated medication list for this problem includes:    Simvastatin 40 Mg Tabs (Simvastatin) .Marland Kitchen... Take 1 tablet by mouth once a day at bedtime  His updated medication list for this problem includes:    Simvastatin 40 Mg Tabs (Simvastatin) .Marland Kitchen... Take 1 tablet by mouth once a day at bedtime  Problem # 4:  ISCHEMIC CARDIOMYOPATHY (ICD-414.8)  See above note.  EF 35%.  His updated medication list for this problem includes:    Altace 2.5 Mg Caps (Ramipril) .Marland Kitchen... Take 1 tablet by mouth once a day    Aspirin 81 Mg Tabs (Aspirin) .Marland Kitchen... Take 1 tablet by mouth once a day    Metoprolol Tartrate 25 Mg Tabs (Metoprolol tartrate) .Marland Kitchen... Take 1 tablet by mouth twice a day  His updated medication list for this problem includes:    Altace 2.5 Mg Caps (Ramipril) .Marland Kitchen... Take 1 tablet by mouth once a day    Aspirin 81 Mg Tabs (Aspirin) .Marland Kitchen... Take 1 tablet by mouth once a day    Metoprolol Tartrate 25 Mg Tabs (Metoprolol tartrate) .Marland Kitchen... Take 1 tablet by mouth twice a day  Patient Instructions: 1)  Your physician recommends that you continue on your current medications as directed. Please refer to the Current Medication list given to you today. 2)  Your physician wants you to follow-up in:  1 YEAR.  You will receive a reminder letter in the mail two months in advance. If you don't receive a letter, please call our office to schedule the follow-up appointment.

## 2010-03-31 NOTE — Assessment & Plan Note (Signed)
Summary: 6 MTH FU--STC   Vital Signs:  Patient profile:   77 year old male Height:      71 inches (180.34 cm) Weight:      227.12 pounds (103.24 kg) O2 Sat:      95 % on Room air Temp:     97.9 degrees F (36.61 degrees C) oral Pulse rate:   48 / minute BP sitting:   132 / 80  (left arm) Cuff size:   regular  Vitals Entered By: Orlan Leavens RMA (December 10, 2009 10:25 AM)  O2 Flow:  Room air CC: 6 month follow-up Is Patient Diabetic? No Pain Assessment Patient in pain? no      Comments flu shot   Primary Care Provider:  Newt Lukes MD  CC:  6 month follow-up.  History of Present Illness: here for 6 month f/u  HTN - doing well no problems with current meds - needs refills  no CP, HA, LE swelling or SOB; no cough  dylipidemia -  no problems on current meds no muscle pains or GI upset  CAD - repeat cath 7/10 by dr. Riley Kill - no critical dz found s/p CABG following MI 1/07 no recurrent symptoms or active angina  ?memory loss - noted by wife - pt with short term memory trouble -  unable to find things that he placed away, occ driving trouble (forgets directions) no HA or weakness; no vision change or trouble speaking, no balance trouble or falls no new meds  Clinical Review Panels:  Lipid Management   Cholesterol:  131 (09/01/2009)   LDL (bad choesterol):  73 (09/01/2009)   HDL (good cholesterol):  41.40 (09/01/2009)  CBC   WBC:  6.2 (06/09/2008)   RBC:  4.73 (06/09/2008)   Hgb:  13.9 (06/09/2008)   Hct:  41.2 (06/09/2008)   Platelets:  163.0 (06/09/2008)   MCV  87.3 (06/09/2008)   MCHC  33.8 (06/09/2008)   RDW  12.7 (06/09/2008)   PMN:  62.2 (06/09/2008)   Lymphs:  22.5 (06/09/2008)   Monos:  11.9 (06/09/2008)   Eosinophils:  3.2 (06/09/2008)   Basophil:  0.2 (06/09/2008)  Complete Metabolic Panel   Glucose:  86 (06/11/2009)   Sodium:  142 (06/11/2009)   Potassium:  4.5 (06/11/2009)   Chloride:  103 (06/11/2009)   CO2:  31 (06/11/2009)   BUN:  10 (06/11/2009)   Creatinine:  1.1 (06/11/2009)   Albumin:  3.9 (09/01/2009)   Total Protein:  7.4 (09/01/2009)   Calcium:  8.8 (06/11/2009)   Total Bili:  0.4 (09/01/2009)   Alk Phos:  86 (09/01/2009)   SGPT (ALT):  13 (09/01/2009)   SGOT (AST):  19 (09/01/2009)   Current Medications (verified): 1)  Altace 2.5 Mg  Caps (Ramipril) .... Take 1 Tablet By Mouth Once A Day 2)  Simvastatin 40 Mg  Tabs (Simvastatin) .... Take 1 Tablet By Mouth Once A Day At Bedtime 3)  Aspirin 81 Mg  Tabs (Aspirin) .... Take 1 Tablet By Mouth Once A Day 4)  Metoprolol Tartrate 25 Mg Tabs (Metoprolol Tartrate) .... Take 1 Tablet By Mouth Twice A Day  Allergies (verified): No Known Drug Allergies  Past History:  Past Medical History: * Hx of SMALL APICAL THROMBUS * PATIENT IN HORIZON STUDY HYPERTENSION  DYSLIPIDEMIA  CAD s/p MI with 3v CABG 02/2005   MD roster: cards - stuckey  Review of Systems  The patient denies fever, weight loss, chest pain, and abdominal pain.  wife noted pt with short term memory loss - see HPI above  Physical Exam  General:  alert, well-developed, well-nourished, and cooperative to examination.   wife at side Lungs:  normal respiratory effort, no intercostal retractions or use of accessory muscles; normal breath sounds bilaterally - no crackles and no wheezes.    Heart:  normal rate, regular rhythm, no murmur, and no rub. BLE without edema. Neurologic:  alert & oriented X3 and cranial nerves II-XII symetrically intact.  strength normal in all extremities, sensation intact to light touch, and gait normal. speech fluent without dysarthria or aphasia; follows commands with good comprehension. Congnition intact to orientation, naming, 2/3 recall and repetition   Impression & Recommendations:  Problem # 1:  MEMORY LOSS (ICD-780.93) screen for metabolic or nutritional defic - also MRI brain - suspect SVD given known atherosclerotic dz start low dose aricpet  while awaiting results - erx done potential risk v benefits and poss SE reviewed with wife/pt who understand and agree to same  Orders: TLB-TSH (Thyroid Stimulating Hormone) (84443-TSH) TLB-B12 + Folate Pnl ([id number]/JXB) Radiology Referral (Radiology) Prescription Created Electronically [phone])  Problem # 2:  CORONARY ARTERY DISEASE (ICD-414.00)  His updated medication list for this problem includes:    Altace 2.5 Mg Caps (Ramipril) .Marland Kitchen... Take 1 tablet by mouth once a day    Aspirin 81 Mg Tabs (Aspirin) .Marland Kitchen... Take 1 tablet by mouth once a day    Metoprolol Tartrate 25 Mg Tabs (Metoprolol tartrate) .Marland Kitchen... Take 1 tablet by mouth twice a day  s/p CABG 200 no active symptoms, last cath 08/2008 report reviewed cont same - f/u with dr. Riley Kill as scheduled  Labs Reviewed: Chol: 131 (09/01/2009)   HDL: 41.40 (09/01/2009)   LDL: 73 (09/01/2009)   TG: 81.0 (09/01/2009)  Problem # 3:  DYSLIPIDEMIA (ICD-272.4)  His updated medication list for this problem includes:    Simvastatin 40 Mg Tabs (Simvastatin) .Marland Kitchen... Take 1 tablet by mouth once a day at bedtime  Labs Reviewed: SGOT: 19 (09/01/2009)   SGPT: 13 (09/01/2009)   HDL:41.40 (09/01/2009), 44.20 (06/11/2009)  LDL:73 (09/01/2009), 78 (06/11/2009)  Chol:131 (09/01/2009), 136 (06/11/2009)  Trig:81.0 (09/01/2009), 69.0 (06/11/2009)  Problem # 4:  HYPERTENSION (ICD-401.9)  His updated medication list for this problem includes:    Altace 2.5 Mg Caps (Ramipril) .Marland Kitchen... Take 1 tablet by mouth once a day    Metoprolol Tartrate 25 Mg Tabs (Metoprolol tartrate) .Marland Kitchen... Take 1 tablet by mouth twice a day  BP today: 132/80 Prior BP: 122/80 (09/08/2009)  Labs Reviewed: K+: 4.5 (06/11/2009) Creat: : 1.1 (06/11/2009)   Chol: 131 (09/01/2009)   HDL: 41.40 (09/01/2009)   LDL: 73 (09/01/2009)   TG: 81.0 (09/01/2009)  Complete Medication List: 1)  Altace 2.5 Mg Caps (Ramipril) .... Take 1 tablet by mouth once a day 2)  Simvastatin 40 Mg Tabs  (Simvastatin) .... Take 1 tablet by mouth once a day at bedtime 3)  Aspirin 81 Mg Tabs (Aspirin) .... Take 1 tablet by mouth once a day 4)  Metoprolol Tartrate 25 Mg Tabs (Metoprolol tartrate) .... Take 1 tablet by mouth twice a day 5)  Aricept 5 Mg Tabs (Donepezil hcl) .Marland Kitchen.. 1 by mouth at bedtime  Other Orders: Flu Vaccine 62yrs + MEDICARE PATIENTS (N5621) Administration Flu vaccine - MCR (H0865) Flu Vaccine Consent Questions     Do you have a history of severe allergic reactions to this vaccine? no    Any prior history of allergic reactions to egg and/or gelatin? no  Do you have a sensitivity to the preservative Thimersol? no    Do you have a past history of Guillan-Barre Syndrome? no    Do you currently have an acute febrile illness? no    Have you ever had a severe reaction to latex? no    Vaccine information given and explained to patient? yes    Are you currently pregnant? no    Lot Number:[id number]   Exp Date:08/28/2010   Site Given  Left Deltoid IMion Flu vaccine - MCR (Z6109)  Patient Instructions: 1)  it was good to see you today. 2)  test(s) ordered today - your results will be posted on the phone tree for review in 48-72 hours from the time of test completion; call [phone] and enter your 9 digit MRN (listed above on this page, just below your name); if any changes need to be made or there are abnormal results, you will be contacted directly. 3)  we'll make referral for MRI brain as discussed. Our office will contact you regarding this appointment once made.  Will then call you with results once reviewed 4)  start Aricept for memory loss - your prescription has been electronically submitted to your pharmacy. Please take as directed. Contact our office if you believe you're having problems with the medication(s).  5)  no other medication changes 6)  Please schedule a follow-up appointment in 3 months to review memory and medications, call sooner if problems.   Prescriptions: ARICEPT 5 MG TABS (DONEPEZIL HCL) 1 by mouth at bedtime  #30 x 3   Entered and Authorized by:   Newt Lukes MD   Signed by:   Newt Lukes MD on 12/10/2009   Method used:   [facility] to        [facility] [facility] [address] [phone]* (retail)       [address]       [place], [place]  [id number]       Ph: [phone] or [phone]       Fax: [phone]   RxID:   [id number]    .lbmedflu

## 2010-04-01 NOTE — Assessment & Plan Note (Signed)
Summary: PER PT 1 MTH B12  INJ--VL---STC  Nurse Visit   Allergies: No Known Drug Allergies  Medication Administration  Injection # 1:    Medication: Vit B12 1000 mcg    Diagnosis: VITAMIN B12 DEFICIENCY (ICD-266.2)    Route: IM    Site: R deltoid    Exp Date: 08/29/2011    Lot #: [id number]    Mfr: [employer]    Patient tolerated injection without complications    Given by: Margaret Pyle, CMA (March 08, 2010 8:24 AM)  Orders Added: 1)  Admin of Therapeutic Inj  intramuscular or subcutaneous [96372] 2)  Vit B12 1000 mcg [J3420]

## 2010-04-01 NOTE — Assessment & Plan Note (Signed)
Summary: 3 MTH FU---STC   Vital Signs:  Patient profile:   77 year old male Height:      71 inches (180.34 cm) Weight:      232.8 pounds (105.82 kg) O2 Sat:      97 % on Room air Temp:     97.9 degrees F (36.61 degrees C) oral Pulse rate:   45 / minute BP sitting:   120 / 78  (left arm) Cuff size:   regular  Vitals Entered By: Orlan Leavens RMA (March 11, 2010 10:12 AM)  O2 Flow:  Room air CC: 3 month follow-up Is Patient Diabetic? No Pain Assessment Patient in pain? no        Primary Care Provider:  Newt Lukes MD  CC:  3 month follow-up.  History of Present Illness: here for 3 month f/u  dementia  - eval 11/2009 for memory loss - prob noted by wife - pt with short term memory trouble -  unable to find things that he placed away, occ driving trouble (forgets directions) no HA or weakness; no vision change or trouble speaking, no balance trouble or falls MRI brain 11/2009 neg, started aricept (and b12 shots) - doing well - reports compliance with ongoing medical treatment and no changes in medication dose or frequency. denies adverse side effects related to current therapy.  improved mood and less irritability  HTN - doing well no problems with current meds - needs refills  no CP, HA, LE swelling or SOB; no cough  dylipidemia -  no problems on current meds no muscle pains or GI upset  CAD - repeat cath 7/10 by dr. Riley Kill - no critical dz found s/p CABG following MI 1/07 no recurrent symptoms or active angina  B12 defic - dx 11/2009 -  onshots for same - doing well - see dementia above   Current Medications (verified): 1)  Altace 2.5 Mg  Caps (Ramipril) .... Take 1 Tablet By Mouth Once A Day 2)  Simvastatin 40 Mg  Tabs (Simvastatin) .... Take 1 Tablet By Mouth Once A Day At Bedtime 3)  Aspirin 81 Mg  Tabs (Aspirin) .... Take 1 Tablet By Mouth Once A Day 4)  Metoprolol Tartrate 25 Mg Tabs (Metoprolol Tartrate) .... Take 1 Tablet By Mouth Twice A Day 5)   Aricept 5 Mg Tabs (Donepezil Hcl) .... Take 1 By Mouth Once Daily  Allergies (verified): No Known Drug Allergies  Past History:  Past Medical History: * Hx of SMALL APICAL THROMBUS * PATIENT IN HORIZON STUDY HYPERTENSION  DYSLIPIDEMIA  CAD s/p MI with 3v CABG 02/2005 dementia, mild   MD roster: cards - stuckey  Review of Systems  The patient denies weight loss, chest pain, syncope, dyspnea on exertion, and headaches.    Physical Exam  General:  alert, well-developed, well-nourished, and cooperative to examination.   wife at side Lungs:  normal respiratory effort, no intercostal retractions or use of accessory muscles; normal breath sounds bilaterally - no crackles and no wheezes.    Heart:  normal rate, regular rhythm, no murmur, and no rub. BLE without edema. Neurologic:  alert & oriented X3 and cranial nerves II-XII symetrically intact.  strength normal in all extremities, sensation intact to light touch, and gait normal. speech fluent without dysarthria or aphasia; follows commands with good comprehension. Congnition intact to orientation, naming, 2/3 recall and repetition Psych:  simple and pleasant man, Oriented X3, memory intact for recent and remote, normally interactive, good eye contact, not  anxious appearing, and not depressed appearing.     Impression & Recommendations:  Problem # 1:  DEMENTIA (ICD-294.8)  eval for same 11/2009 - started aricept after neg MRI brain also started B12 shots (defic dx 11/2009) improved mood and memory per pt and spuse - pt less irritable will inc dose now - erx done recheck 3 mo, to call sooner if problems  Orders: Prescription Created Electronically [id number])  Problem # 2:  VITAMIN B12 DEFICIENCY (ICD-266.2) dx 11/2009 during evla for memory loss - will try to maintain level takingoral supplement rather than IM qmo recheck b12 next ov after no im x 3 mo  Complete Medication List: 1)  Altace 2.5 Mg Caps (Ramipril) .... Take 1  tablet by mouth once a day 2)  Simvastatin 40 Mg Tabs (Simvastatin) .... Take 1 tablet by mouth once a day at bedtime 3)  Aspirin 81 Mg Tabs (Aspirin) .... Take 1 tablet by mouth once a day 4)  Metoprolol Tartrate 25 Mg Tabs (Metoprolol tartrate) .... Take 1 tablet by mouth twice a day 5)  Aricept 10 Mg Tabs (Donepezil hcl) .Marland Kitchen.. 1 by mouth once daily 6)  [facility] Vitamin B12 1000 Mcg Tabs (Cyanocobalamin) .Marland Kitchen.. 1 by mouth once daily  Patient Instructions: 1)  it was good to see you today. 2)  increase dose Aricept for memory - your prescription has been electronically submitted to your pharmacy. Please take as directed. Contact our office if you believe you're having problems with the medication(s).  3)  stop B12 shots each month and take B12 pill once daily  4)  Please schedule a follow-up appointment in 3 months to review memory and recheck labs (b12), call sooner if problems.  Prescriptions: ARICEPT 10 MG TABS (DONEPEZIL HCL) 1 by mouth once daily  #30 x 6   Entered and Authorized by:   Newt Lukes MD   Signed by:   Newt Lukes MD on 03/11/2010   Method used:   [facility]        [facility]  [address] [phone]* (retail)       [address]       [place], [place]  [id number]       Ph: [phone] or [phone]       Fax: [phone]   RxID:   [id number]    Orders Added: 1)  Est. Patient Level IV [42595] 2)  Prescription Created Electronically [id number]

## 2010-05-19 ENCOUNTER — Encounter: Payer: Self-pay | Admitting: *Deleted

## 2010-06-06 LAB — CBC
Platelets: 144 10*3/uL — ABNORMAL LOW (ref 150–400)
WBC: 5.6 10*3/uL (ref 4.0–10.5)

## 2010-06-06 LAB — PROTIME-INR
INR: 0.9 (ref 0.00–1.49)
Prothrombin Time: 12.7 seconds (ref 11.6–15.2)

## 2010-06-06 LAB — BASIC METABOLIC PANEL
BUN: 13 mg/dL (ref 6–23)
Calcium: 8.9 mg/dL (ref 8.4–10.5)
Creatinine, Ser: 1.1 mg/dL (ref 0.4–1.5)
GFR calc Af Amer: >60 mL/min (ref >60)
GFR calc non Af Amer: >60 mL/min (ref >60)

## 2010-06-10 ENCOUNTER — Telehealth: Payer: Self-pay | Admitting: Internal Medicine

## 2010-06-10 ENCOUNTER — Other Ambulatory Visit (INDEPENDENT_AMBULATORY_CARE_PROVIDER_SITE_OTHER): Payer: Medicare Other

## 2010-06-10 ENCOUNTER — Encounter: Payer: Self-pay | Admitting: Internal Medicine

## 2010-06-10 ENCOUNTER — Ambulatory Visit (INDEPENDENT_AMBULATORY_CARE_PROVIDER_SITE_OTHER): Payer: Medicare Other | Admitting: Internal Medicine

## 2010-06-10 VITALS — BP 142/70 | HR 45 | Temp 97.6°F | Ht 71.0 in | Wt 229.1 lb

## 2010-06-10 DIAGNOSIS — F068 Other specified mental disorders due to known physiological condition: Secondary | ICD-10-CM

## 2010-06-10 DIAGNOSIS — E538 Deficiency of other specified B group vitamins: Secondary | ICD-10-CM

## 2010-06-10 DIAGNOSIS — I10 Essential (primary) hypertension: Secondary | ICD-10-CM

## 2010-06-10 DIAGNOSIS — I2510 Atherosclerotic heart disease of native coronary artery without angina pectoris: Secondary | ICD-10-CM

## 2010-06-10 LAB — FOLATE: Folate: 13 ng/mL (ref >5.9)

## 2010-06-10 LAB — CBC WITH DIFFERENTIAL/PLATELET
Eosinophils Absolute: 0.2 10*3/uL (ref 0.0–0.7)
Eosinophils Relative: 3.2 % (ref 0.0–5.0)
HCT: 40.4 % (ref 39.0–52.0)
Lymphs Abs: 1.5 10*3/uL (ref 0.7–4.0)
MCHC: 33.5 g/dL (ref 30.0–36.0)
MCV: 88.5 fl (ref 78.0–100.0)
Monocytes Absolute: 0.8 10*3/uL (ref 0.1–1.0)
Neutrophils Relative %: 62.1 % (ref 43.0–77.0)
Platelets: 164 10*3/uL (ref 150.0–400.0)
RDW: 13.9 % (ref 11.5–14.6)
WBC: 6.9 10*3/uL (ref 4.5–10.5)

## 2010-06-10 NOTE — Telephone Encounter (Signed)
Pt Notified with labs results spoke with pt wife.Marland KitchenMarland Kitchen4/12/11@2 :156pm/lmb

## 2010-06-10 NOTE — Assessment & Plan Note (Signed)
Dx 11/2009 on eval for memory loss status post one shot but has opted for oral supplements since -  Recheck now - if normal, no change needed; if low, resume IM replacement Also check CBC and folate

## 2010-06-10 NOTE — Assessment & Plan Note (Signed)
MI followed by CABG x 3 02/2005 No anginal symptoms  Cath 08/2008 without change - The current medical regimen is effective;  continue present plan and medications. Follows with cards annually for same

## 2010-06-10 NOTE — Progress Notes (Signed)
Subjective:    Patient ID: Jacob Costa, male    DOB: 04/18/1932, 77 y.o.   MRN: [id number]  HPI  here for 3 month f/u  dementia  - eval 11/2009 for memory loss - problem noted by wife:short term memory trouble -  unable to find things that he placed away, occ driving trouble (forgets directions) no HA or weakness; no vision change or trouble speaking, no balance trouble or falls MRI brain 11/2009 neg, started aricept (and b12 shots) - doing well - dose aricept increased 02/2010 reports compliance with ongoing medical treatment and no changes in medication dose or frequency. denies adverse side effects related to current therapy.  improved mood and less irritability per the patient and wife  HTN - doing well no problems with current meds - no CP, HA, LE swelling or SOB; no cough  dylipidemia -  no problems on current meds no muscle pains or GI upset  CAD - repeat cath 7/10 by dr. Riley Kill - no critical dz found s/p CABG following MI 1/07 no recurrent symptoms or active angina  B12 defic - dx 11/2009 -  Started on shots for same, but opted to change to oral therapy - doing well - see dementia above   Past Medical History  Diagnosis Date  . VITAMIN B12 DEFICIENCY 11/2009 dx  . OBESITY 06/27/2007  . CORONARY ATHEROSCLEROSIS NATIVE CORONARY ARTERY 02/2005    MI followed by CABGx3  . ISCHEMIC CARDIOMYOPATHY 01/13/2007  . PERCUTANEOUS TRANSLUMINAL CORONARY ANGIOPLASTY, HX OF 01/13/2007  . DEMENTIA 03/11/2010  . DYSLIPIDEMIA   . HYPERTENSION   . MYOCARDIAL INFARCTION 02/2005     Review of Systems  Constitutional: Negative for fatigue and unexpected weight change.  Respiratory: Negative for shortness of breath.   Cardiovascular: Negative for chest pain.  Psychiatric/Behavioral: Negative for hallucinations, dysphoric mood and agitation.       Objective:   Physical Exam Blood pressure 142/70, pulse 45, temperature 97.6 F (36.4 C), temperature source Oral, height 5\' 11"   (1.803 m), weight 229 lb 1.9 oz (103.928 kg), SpO2 97.00%. Wt Readings from Last 3 Encounters:  06/10/10 229 lb 1.9 oz (103.928 kg)  03/11/10 232 lb 12.8 oz (105.597 kg)  12/10/09 227 lb 1.9 oz (103.02 kg)   Temp Readings from Last 3 Encounters:  06/10/10 97.6 F (36.4 C) Oral   BP Readings from Last 3 Encounters:  06/10/10 142/70  03/11/10 120/78  12/10/09 132/80   Pulse Readings from Last 3 Encounters:  06/10/10 45  03/11/10 45  12/10/09 48    Physical Exam  Constitutional:  oriented to person, place, and time. appears well-developed and well-nourished. No distress. Wife at side. Cardiovascular: Mild brady rate, regular rhythm and normal heart sounds.  No murmur heard. Pulmonary/Chest: Effort normal and breath sounds normal. No respiratory distress. no wheezes.  Neurological: he is alert and oriented to person, place, and time. No cranial nerve deficit. Coordination normal. Good attention and recall. Psyc: pleaseant mood and normal affect. Quiet and reserved but interactive when asked to participate.      Lab Results  Component Value Date   WBC 6.9 06/10/2010   HGB 13.6 06/10/2010   HCT 40.4 06/10/2010   PLT 164.0 06/10/2010   CHOL 131 09/01/2009   TRIG 81.0 09/01/2009   HDL 41.40 09/01/2009   ALT 13 09/01/2009   AST 19 09/01/2009   NA 142 06/11/2009   K 4.5 06/11/2009   CL 103 06/11/2009   CREATININE 1.1 06/11/2009   BUN  10 06/11/2009   CO2 31 06/11/2009   TSH 1.86 12/10/2009   INR 0.9 09/03/2008   HGBA1C 5.9 07/02/2007   Lab Results  Component Value Date   VITAMINB12 840 06/10/2010                                       164                                                                 12/15/2009  Assessment & Plan:  See problem list. Medications and labs reviewed today.

## 2010-06-10 NOTE — Patient Instructions (Addendum)
It was good to see you today. Test(s) ordered today. Your results will be called to you after review (48-72hours after test completion). If any changes need to be made, you will be notified at that time. Medications reviewed, no changes at this time. Please schedule followup in 3-4 months to monitor BP and cholesterol, call sooner if problems.

## 2010-06-10 NOTE — Assessment & Plan Note (Signed)
Started on Aricept 11/2009 and dose increase 02/2010 - also ongoing B12 replacement MRI brain 11/2009 unremarkable symptoms improved per wife and the patient - The current medical regimen is effective;  continue present plan and medications.

## 2010-06-10 NOTE — Assessment & Plan Note (Signed)
Mild elevation BP today, note stable bradycardia on current Bbloc dose If remains elevated, consider increase ACEI dose BP Readings from Last 3 Encounters:  06/10/10 142/70  03/11/10 120/78  12/10/09 132/80

## 2010-06-10 NOTE — Telephone Encounter (Signed)
Please call patient - normal results. No medication changes recommended - continue b12 pills, no shots needed. Thanks.  Lab Results  Component Value Date   VITAMINB12 840 06/10/2010   Lab Results  Component Value Date   WBC 6.9 06/10/2010   HGB 13.6 06/10/2010   HCT 40.4 06/10/2010   MCV 88.5 06/10/2010   PLT 164.0 06/10/2010

## 2010-07-13 NOTE — Letter (Signed)
August 01, 2007    Barbette Hair. Artist Pais, DO  [address] [place], [place] [address]   RE:  Jacob Costa, Jacob Costa  MRN:  [id number]  /  DOB:  1932/04/06   Dear Nadine Counts:   I had the pleasure of seeing Jacob Costa in the office today in  followup.  As you know, he had a prior myocardial infarction followed by  revascularization surgery.  You have recently increased his Zocor to 40  mg daily.  His lipid profile has been done in your office.  He feels  well and he does some modestly regular exercise.   Today on examination the blood pressure is 136/78, the pulse is 62.  There are no carotid bruits.  The lung fields are clear to auscultation  and percussion.  The cardiac rhythm is regular.   In summary, the patient is doing well from a cardiac standpoint.  His  electrocardiogram today demonstrates normal sinus rhythm with voltage  criteria for left ventricular hypertrophy and nonspecific T-wave  flattening, otherwise looks unremarkable.   I have recommended that he continue on his current medical regimen.  This includes a low dose beta blockade, aspirin, simvastatin and Altace.  We will see him back in 1 year's time, but I  would be happy to see him earlier if any problems develop.  I have  encouraged him to remain active.   I appreciate the opportunity of sharing in his care.    Sincerely,      Arturo Neault. Riley Kill, MD, [facility]  Electronically Signed    TDS/MedQ  DD: 08/01/2007  DT: 08/01/2007  Job #: [id number]

## 2010-07-13 NOTE — Letter (Signed)
January 04, 2007    Barbette Hair. Dumont, DO  [address] [place], [place] [address]   RE:  IDO, WOLLMAN  MRN:  [id number]  /  DOB:  1933/02/16   Dear Dr. Artist Pais,   I had the pleasure of seeing your nice patient, Vernard Gram, in the  office today in a follow-up visit.  In general, he has been doing well.  He denies any ongoing chest pain or shortness of breath.  He has not  exercised all that much.   However, he has continued to do well from the standpoint of his  revascularization surgery.   MEDICATIONS:  1. Aspirin 81 mg daily.  2. Lopressor 25 mg b.i.d.  3. Simvastatin 40 mg daily.  4. Altace 2.5 mg daily.   His last lipid profile in May of this year resulted in an LDL of 73 and  an HDL of 37.   To basically summarize, this gentleman is doing well from a cardiac  standpoint.  I have encouraged him to try to lose some weight.  He will  need to have followup of his lipids.  We are  giving him a flu shot today, but other than that, we are just continuing  to monitor him.  He is doing so well that we will plan to see him back  in followup in one year.  Should any problems arise in the interim,  please do not hesitate to call me, and I would be glad to see him at any  time.    Sincerely,      Arturo Cogburn. Riley Kill, MD, [facility]  Electronically Signed    TDS/MedQ  DD: 01/04/2007  DT: 01/04/2007  Job #: [id number]

## 2010-07-13 NOTE — Cardiovascular Report (Signed)
NAME:  Jacob Costa, Jacob Costa NO.:  [id number]   MEDICAL RECORD NO.:  [id number]          PATIENT TYPE:  OIB   LOCATION:  2899                         FACILITY:  [facility]   PHYSICIAN:  Arturo Bonsignore. Riley Kill, MD, FACCDATE OF BIRTH:  12/21/1932   DATE OF PROCEDURE:  DATE OF DISCHARGE:  09/03/2008                            CARDIAC CATHETERIZATION   INDICATIONS:  Mr. Baik is a very delightful 77 year old gentleman,  well known to me previously presented with an acute anterior wall  myocardial infarction in January 2007.  At that time, he had placement  of a Liberte Monorail 2.75 x 24 nondrug-eluting stent.  He had severe  multivessel disease, and underwent revascularization surgery in the peri-  infarct.  At that time, Dr. Evelene Croon placed an internal mammary to  the LAD, saphenous vein graft to the OM, and a saphenous vein graft to  the intermediate.  In general, he has done clinically well.  He recently  was seen in the office and had a fairly prominent new T-wave inversion  in the anterior precordial leads.  Although he had had a prior anterior  infarct, we were uncertain as to his etiology and the patient did not  had significant chest pain prior to his initial presentation.  As a  result, we went ahead and did a nuclear study which suggested scar with  some ischemia, and I met with the patient and his wife and we discussed  this in extensive detail.  We presented the choices of continued medical  therapy, a re-look angiography.  Given his prior presentation and his  evident EKG changes, so we elected to recommend repeat catheterization  to evaluate his anatomy.  The patient was sent home, was thinking about  this, and then called back and wanted to proceed.  He was set up for  diagnostic cath today.   PROCEDURES:  1. Left heart catheterization  2. Selective coronary arteriography.  3. Selective left ventriculography.  4. Saphenous vein graft angiography.  5. Selective  left internal mammary angiography.   DESCRIPTION OF PROCEDURE:  The patient was brought to the  Catheterization Laboratory, prepped and draped in usual fashion.  Through an anterior puncture, the right femoral artery was easily  entered.  A 5-French sheath was then placed.  Views of the left and  right coronary arteries were then obtained.  Vein graft angiography was  performed.  We had some difficulty cannulating the left subclavian.  We  eventually ended up using a 6-French sheath with 6-French guiding  catheter, and we were able to get a Glidewire out into the left axillary  artery.  The guide catheter was then placed near the mammary artery and  injection was performed.  Central aortic and left ventricular pressures  were subsequently measured using a pigtail and ventriculography done in  the RAO projection.  He tolerated the procedure well.  I reviewed the  films with the patient and subsequently took pictures to his wife.  There were no major complications.   HEMODYNAMIC DATA:  1. The central aortic pressure was 143/57, mean 88.  2.  LV pressure 150/22.  3. There was no gradient or pullback across the aortic valve.   ANGIOGRAPHIC DATA:  1. The left main coronary artery is long and free of critical disease.      There is some calcification and irregularity but perhaps 20%      narrowing prior to the takeoff of the circumflex intermediate      system.  There is very little visualization of the circumflex      intermediate system at the present time.  There is a proximal 30-      40% area of narrowing in the LAD followed by a previously stented      vessel at the site of the acute infarct.  The LAD stent is widely      patent with excellent antegrade flow down the LAD.  There is      competitive flow at the distal vessel.  2. The internal mammary to the distal LAD is widely patent, but there      is some very mild atresia and competitive filling due to good      antegrade flow in  the native LAD.  3. The circumflex system is minimally visualized.  There is an AV      circumflex and intermediate system noted on the previous      angiographic study that have demonstrated high-grade disease.  4. The saphenous vein graft to the intermediate is widely patent with      excellent flow.  5. The saphenous vein graft to the distal circumflex is widely patent      with excellent flow.  6. The native right coronary artery has mild tapered irregularity but      no significant focal stenosis and provides flow to the inferior      wall.  7. Ventriculography in the RAO projection reveals a clear-cut      anteroapical wall motion abnormality with ejection fraction      estimated in the range of 35%.   CONCLUSION:  1. Continued patency of the internal mammary to the LAD, saphenous      vein graft to the intermediate, and saphenous vein graft to the OM.  2. Continued patency of the acute infarct stent in the left anterior      descending artery.  3. Moderate reduction in left ventricular function.   DISPOSITION:  Continued medical therapy will be recommended.  He will  see me back in the office in followup.      Arturo Mccolgan. Riley Kill, MD, [facility]  Electronically Signed     TDS/MEDQ  D:  09/03/2008  T:  09/04/2008  Job:  [id number]   cc:   Barbette Hair. Artist Pais, DO

## 2010-07-16 NOTE — Op Note (Signed)
NAME:  Jacob Costa, Jacob Costa NO.:  [id number]   MEDICAL RECORD NO.:  [id number]          PATIENT TYPE:  INP   LOCATION:  2308                         FACILITY:  [facility]   PHYSICIAN:  Evelene Croon, M.D.     DATE OF BIRTH:  13-Sep-1932   DATE OF PROCEDURE:  04/01/2005  DATE OF DISCHARGE:                                 OPERATIVE REPORT   PREOPERATIVE AND POSTOPERATIVE DIAGNOSIS:  Severe 2-vessel coronary disease  status post acute anterior myocardial infarction.   OPERATIVE PROCEDURE:  Median sternotomy, extracorporeal circulation,  coronary bypass graft surgery x3 using a left internal mammary artery graft  to the left anterior descending coronary, with a saphenous vein graft to the  intermediate coronary artery, and a saphenous vein graft to the obtuse  marginal branch of the left circumflex coronary artery.  Endoscopic vein  harvesting from the left leg.   ATTENDING SURGEON:  Evelene Croon, M.D.   ASSISTANT:  Salvatore Decent. Dorris Fetch, M.D.   SECOND ASSISTANT:  Pecola Leisure, PA-C.   THIRD ASSISTANT:  Constance Holster, PA-C   ANESTHESIA:  General endotracheal.   CLINICAL HISTORY:  This patient is a 77 year old gentleman who was admitted  with an acute anterior myocardial infarction due to LAD occlusion. Cardiac  catheterization was performed emergently; and showed about 30% distal left  main taper. There was complete occlusion of the LAD, proximally and this was  opened and stented with a nondrug eluding stent, successfully. The patient  also had about 80% stenosis of the large bifurcating intermediate vessel.  The stenosis was proximal to the bifurcation. The left circumflex had about  90% ostial stenosis and gave off a single medium-size marginal branch. The  right coronary artery had about 30% mid distal narrowing. Left ventricular  ejection fraction was calculated at 27% with anterior apical and inferior  apical akinesis. The patient remained stable after his  procedure with no  further chest pain. After review of the angiogram, and examination the  patient; it was felt that coronary bypass graft surgery was the best  treatment to prevent further ischemia and infarction. I discussed the  operative procedure with the patient and his wife. We discussed  alternatives, benefits, and risks including bleeding, blood transfusion,  infection, stroke, myocardial infarction, graft failure, and death. They  understood, and agreed to proceed.   OPERATIVE PROCEDURE:  The patient was taken off the room, placed on table  supine position. After induction of general endotracheal anesthesia, a Foley  catheter was placed in the bladder as a sterile technique. Then the chest,  abdomen, and both lower extremities were prepped and draped in the usual  sterile manner. The chest was entered through a median sternotomy incision;  and the pericardial opened in the midline. Examination of the heart showed  fairly good ventricular contractility. There was hypokinesis of the anterior  wall. The ascending aorta had no palpable plaques in it.   Then the  left internal mammary artery was harvested from the chest wall as  a pedicle graft. This was a medium sized vessel with excellent blood flow  through it.  At the same time a segment of greater saphenous vein was  harvested from the left thigh using endoscopic vein harvest technique. This  vein was of medium size and good quality.   Then the patient was heparinized; and when an adequate clotting time was  achieved; the distal ascending aorta was cannulated using a 20-French aortic  cannula for arterial inflow. Venous outflow was achieved using a 2-stage  venous cannula for the right atrial appendage. Antegrade cardioplegia and  vent cannula was inserted in the aortic root.   The patient was placed on cardiopulmonary bypass and the distal coronary  artery was identified. The LAD was a large graftable vessel. The   intermediate was intramyocardial; but was located within the muscle and one  of the sub-branches was a large graftable vessel. These two sub-branches  appeared to communicate well on catheterization. The obtuse marginal branch  was located high on the lateral wall where it was a medium size graftable  vessel. The right coronary artery had no significant plaque in it.   Then the aorta was cross clamped and 500 mL of cold blood antegrade  cardioplegia was administered in the aortic root with quick arrest of the  heart. Systemic hypothermia to 28-degrees centigrade and topical hypothermic  iced saline was used. A temperature probe was placed in septum and  insulating pad in the pericardium.   The first distal anastomosis was formed to the intermediate coronary artery.  The internal diameter was about 2 mm. The conduit used was a segment of  greater saphenous vein.  The anastomosis performed in an end-to-side manner  using continuous 7-0 Prolene suture. Flow admitted through the graft and was  excellent.   The second distal anastomosis was performed to the obtuse marginal branch.  The internal diameter of vessel was about 1.6 mm. The conduit used was a  second segment of greater saphenous vein.  The anastomosis was performed in  an end-to-side manner using continuous 7-0 Prolene suture. Flow admitted  through the graft was excellent. Then another dose cardioplegia was given.   The third distal anastomosis was performed to the midportion of left  anterior descending coronary artery. The internal diameter was about 2.5 mm.  The conduit used was a left internal mammary graft and was brought through  an opening in the left pericardium, anterior to the phrenic nerve. It was  anastomosed to the LAD in an end-to-side manner using continuous 8-0 Prolene  suture. The pedicle was sutured to the epicardium with 6-0 Prolene sutures. The patient was rewarmed to 37-degrees centigrade. With the crossclamp  in  place, the two proximal vein graft anastomoses were performed to the aortic  root in an end-to-side manner using continuous 6-0 Prolene suture. Then the  clamp was removed from the mammary pedicle. There was rapid warming of the  ventricular septum and return of spontaneous ventricular fibrillation. The  crossclamp was removed with the time of 55 minutes; and the patient  spontaneously converted to sinus rhythm. The proximal and distal anastomoses  appeared hemostatic, and __________ to the graft satisfactory. Graft markers  were then placed around the proximal anastomoses. Two temporary right  ventricular and right atrial pacing wires placed and brought out through the  skin.   When the patient had rewarmed to 37-degrees centigrade; he was weaned from  cardiopulmonary bypass on low-dose dopamine. Total bypass time was 71  minutes. Cardiac function appeared excellent with a cardiac output of 5.5  L/min. Protamine was  given and the venous and aortic catheters were removed  without difficulty. Hemostasis was achieved. Three chest tubes were placed,  one tube in the posterior pericardium, one in the left pleural space, and  one in the anterior mediastinum. The pericardium was loosely reapproximated  over the heart. The sternum was closed with #6 stainless steel wires. The  fascia was closed with continuous #1 Vicryl suture. Subcu tissue was closed  with continuous 2-0 Vicryl; and the skin with 3-0 Vicryl subcuticular  closure. The lower extremity vein harvest site was closed in layers in a  similar manner. Sponge, needle, and instrument counts were correct at the  end of the procedure.  Dry sterile dressings were applied over the incisions and around the chest  tubes which were hooked Pleur-Evac suction. The patient remained  hemodynamically stable, and was transported to the SICU in guarded, but  stable condition.      Evelene Croon, M.D.  Electronically Signed     BB/MEDQ  D:   04/01/2005  T:  04/01/2005  Job:  [id number]   cc:   CVTS Office   [facility]   Cardiac Cath Lab
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
## 2010-07-16 NOTE — Op Note (Signed)
NAME:  Jacob Costa, LINAREZ NO.:  [id number]   MEDICAL RECORD NO.:  [id number]                   PATIENT TYPE:  OBV   LOCATION:  0340                                 FACILITY:  [facility]   PHYSICIAN:  Lorre Munroe., M.D.            DATE OF BIRTH:  [birth year]   DATE OF PROCEDURE:  09/03/2003  DATE OF DISCHARGE:                                 OPERATIVE REPORT   PREOPERATIVE DIAGNOSES:  1. Gallstones with chronic cholecystitis.  2. Incisional hernia.   POSTOPERATIVE DIAGNOSES:  1. Gallstones with chronic cholecystitis.  2. Incisional hernia.   OPERATIONS:  1. Laparoscopy.  2. Extensive lysis of adhesions.  3. Cholecystectomy.  4. Repair of incisional hernia.   SURGEON:  Lebron Conners, M.D.   ASSISTANT:  Leonie Man, M.D.   ANESTHESIA:  General.   PROCEDURE:  After the patient was monitored and anesthesia, had a Foley  catheter with routine preparation and draping of the abdomen, I first made a  short transverse incision in the right mid abdomen well lateral, dissected  down to the external oblique and opened it in the direction of the fibers.  Then bluntly dissected further until I came to the transversalis fascia,  which I picked up and opened.  I put a 0 Vicryl purse-string suture in the  fascia and secured a Hasson cannula.  Then inflated the abdomen with CO2 and  examined the abdominal contents with a laparoscope.  There were extensive  adhesions in the left upper quadrant and other parts of the upper abdomen.  There was considerable small intestine residing in a large ventral  incisional hernia in the midline.  The lower abdomen was relatively free of  adhesions.  I put in two left lower quadrant 5 mm ports, again taking down  the adhesions and removing the bowel from the incisional hernia.  The  incisional hernia was large and complex.  There was a bowel with three  separate defects in the midline.  There was at least one other small  defect  containing omentum only.  Dissection was tedious and took quite a long time,  but I was able to completely get all of the hernia contents reduced and had  things set up so that a laparoscopic incisional hernia repair could have  been done; however, the patient was noted to have symptomatic gallstones,  had previously been jaundiced, and it was felt that we needed to take out  his gallbladder.  There was a lot of adhesion remaining in the right upper  quadrant.  I put in additional laparoscopic ports, and I was able to  identify the gallbladder and get some of the adhesions away from it but  found that scar tissue was extremely dense and difficult to dissect.  The  duodenum was closely adherent to the liver and to the gallbladder, and it  seemed obvious after a considerable time that there  was not much prospect of  completing the operation laparoscopically.  We made the decision to perform  an open procedure, and I made an upper midline incision through the  incisional hernia, and connected the hernia defects.  I then exposed the  right upper quadrant, and the dissection remained quite difficult to do  because of the extremely dense scar tissue and distortion of the anatomy.  I  dissected the gallbladder from the liver from the fundus downward, taking  care to stay close to the gallbladder and dissecting down, attempting to  find the cystic duct.  I found the structure, which I felt was the cystic  artery, and I clipped and divided it.  I then made a small cut in the  structure, which I felt was a cystic duct, in hopes of performing operative  cholangiogram and found that there was essentially no lumen, and I could not  perform the cholangiogram.  I clipped it distally and divided it and then  continued completion of the dissection and removed the gallbladder and all  of the stones.  Several stones spilled out, but I felt that I had removed  all of the gallstones.  I then irrigated  the area out and removed the  irrigant.  As I examined the dissected area, I saw a small amount of bile  leak.  I examined it further and found what appeared to be a small injury in  the lateral aspect of the bile duct.  I cannulated that with a Reddick  cholangiogram catheter and performed a cholangiogram, and this appeared to  be a hole in the lateral wall of the right hepatic duct.  Distal bile duct  and the common hepatic duct, common bile duct, and left hepatic ducts were  of normal size, and there were no filling defects, and there was free flow  into the duodenum.  There appeared to be a little bit of tissue removed with  the dissection, so I did not feel that a lateral repair was indicated.  I  got a small T tube and trimmed the intraductal lumen to fit nicely, placed  it in, and it irrigated well.  I sutured it in with 4-0 Vicryl.  I then  brought it out through a separate incision on the lateral abdominal wall.  I  placed a suction drain through a separate incision and brought it out.  I  laid it near the T tube.  I felt that hemostasis was good.  The sponge,  needle, and instrument counts were correct.   I then repaired the incisional hernia.  I mobilized skin and subcutaneous  flaps laterally all the way to the edge of the rectus muscle on each side  and cut relaxing incisions in the anterior rectus sheath on each side.  I  debrided unhealthy and attenuated fascia.  I closed the hernia defect with  running #1 Novofil suture and felt that closure was without undue tension.  I felt that it was unwise to place mesh on the incision at this point  because of the long operation and possible contamination of bile and stones.  I placed a suction drain in the subcutaneous space in the hernia repair and  brought it out through a separate stab incision, secured with a stent.  All  of the drains appeared to work well.  I closed the skin of all incisions with staples after debriding excess  skin over the midline incision.  I had  previously  removed all of the laparoscopic ports and had previously  irrigated the abdominal cavity and removed the irrigant.  Patient tolerated  the operation well and was stable on going to recovery room.                                               Lorre Munroe., M.D.    Jodi Marble  D:  09/03/2003  T:  09/03/2003  Job:  [id number]   cc:   Venita Lick. Russella Dar, M.D. [facility]

## 2010-07-16 NOTE — Letter (Signed)
Jul 05, 2006    Barbette Hair. [place], DO  [address] [place], [place] [address]   RE:  Jacob, Costa  MRN:  [id number]  /  DOB:  08-14-1932   Dear Dr. Artist Pais:   I had the pleasure of seeing your nice patient, Jacob Costa, in the  office today in a followup visit.  Cardiac-wise, Jacob Costa is getting along  extremely well.  Jacob Costa denies any ongoing chest pain or significant  shortness of breath.  Jacob Costa actually walked 5 miles yesterday.  As you  know, Jacob Costa has an ejection fraction of about 40%, and previously had  declined evaluation for an ICD.  Fortunately, his ejection fraction is  around 40%, and Jacob Costa has had treatment of ischemia.  Jacob Costa denies any syncope  or presyncope.  Jacob Costa has tolerated his medicines well.   CURRENT MEDICATIONS:  Include:  1. Lopressor 25 mg p.o. b.i.d.  2. Simvastatin 40 mg daily.  3. Altace 2.5 daily.  4. Aspirin 81 mg daily.   PHYSICAL EXAMINATION:  The weight is 225 pounds.  Blood pressure 122/70,  and the pulse is 47 and regular.  There are no carotid bruits.  There is a soft S4 gallop.  The PMI is nondisplaced.  There is a normal  1st and 2nd heart sound with normal splitting.  There is no diastolic or  systolic murmurs.  ABDOMEN:  Soft.  EXTREMITIES:  Reveal no edema.   The electrocardiogram demonstrates fairly marked sinus bradycardia with  borderline 1st degree A-V block.  The PR interval is only 226 msec.  There is a probable septal infarct of indeterminate age, and T wave  inversion in the lateral leads.   I am extremely pleased with how Jacob Costa has done.  I have recommended that Jacob Costa  continue on his current medical regimen.  Jacob Costa walks daily.  We also would  suggest that Jacob Costa have a lipid and liver profile.  I will see him back in  6 months' time, and if Jacob Costa is doing well, completely turn him over to you  at that point.  I appreciate the opportunity of sharing in this nice  gentleman's care.    Sincerely,      Arturo Daughtrey. Riley Kill, MD, [facility]  Electronically Signed    TDS/MedQ  DD: 07/05/2006  DT: 07/05/2006  Job #: [id number]

## 2010-07-16 NOTE — Letter (Signed)
September 15, 2005      Jacob Lemons, MD  [address] [place], [place] [address]   RE:  Jacob, Costa  MRN:  [id number]  /  DOB:  1932-06-05   Dear Dr. Artist Pais:   I had the pleasure of seeing Jacob Costa in the office today in a followup  visit.  As you know, he underwent revascularization surgery later by Dr.  Laneta Simmers.  His EF was initially 27%.  The large ramus had 80% stenosis and the  AV circumflex had 90% stenosis.  The patient then underwent coronary artery  bypass graft surgery with an internal mammary placed to the left anterior  descending artery, saphenous vein graft to the intermediate coronary artery  and saphenous vein graft to the obtuse marginal branch of the left  circumflex artery.   Since that time, he has done reasonably well.  We got a followup  echocardiogram in April.  At that time, his EF was 30% to 35% and a left  ventricular mural thrombus could not be excluded.  We had started him on  Coumadin in January because of a probable thrombus at the apex.  He has  continued to get along well.  I referred him to the electrophysiology  service, specifically for consideration of ICD placement.   The patient has considered this carefully, and he says that he really does  not want to have an ICD placed.  I reviewed with him again all of the  indications.  He did not speak with his wife about it specifically.   Medications include aspirin 81 mg daily, metoprolol 50 mg p.o. b.i.d.,  Coumadin as directed, Vytorin 10/20 mg nightly and Altace 2.5 mg p.o. b.i.d.   On physical examination, he is an alert and oriented gentleman in no  distress.  Weight is 223 pounds, blood pressure 122/70 and the pulse is 46.  The lung fields are clear.  The median sternotomy is well-healed.  The PMI  is nondisplaced and normal first and second heart sounds with an S4 gallop.   The electrocardiogram demonstrated sinus bradycardia.  There is borderline  first-degree A-V block.  There is evidence of a  septal infarct with some T  wave inversion in the anterior precordial leads.  This is perhaps slightly  more prominent than on previous tracings, but again not inconsistent with  findings.  He has had recent stools that have been Hemoccult-negative.   Recent cholesterol was 120 with an LDL of 71 and an HDL of 32.   We plan to have him return to followup in the clinic in about 2-3 weeks, at  which time we will get an echocardiogram.  If there is no change in the  echocardiogram, we will go ahead at that point and discontinue his Coumadin.   I appreciate the opportunity of sharing in this gentleman's care.  I will  send you a note when we see him back.    Sincerely,      Jacob Leard. Riley Kill, MD, [facility]   TDS/MedQ  DD:  09/15/2005  DT:  09/15/2005  Job #:  [id number]

## 2010-07-16 NOTE — Discharge Summary (Signed)
NAME:  Jacob Costa, LEEMAN NO.:  [id number]   MEDICAL RECORD NO.:  [id number]                   PATIENT TYPE:  INP   LOCATION:  0340                                 FACILITY:  [facility]   PHYSICIAN:  Lorre Munroe., M.D.            DATE OF BIRTH:  [birth year]   DATE OF ADMISSION:  09/03/2003  DATE OF DISCHARGE:  09/08/2003                                 DISCHARGE SUMMARY   HISTORY:  This is a 77 year old white male who was sent to me by Dr. Russella Dar  because of gallstones and jaundice.  The jaundice had resolved.  There was a  lot of nausea associated with some pain.  He also was found to have a large  incisional hernia.  Bilirubin had been as high as 7 but had gone down to  normal.  Ultrasound showed gallstones with a slightly dilated common bile  duct.  The patient was brought into the hospital for a cholecystectomy and  repair of the incisional hernia.  It was felt that he might very well have  choledocholithiasis.  Other than obesity, the patient has no other serious  chronic medical problems.   PHYSICAL EXAMINATION:  He was overweight.  There was a large incisional  hernia.  The abdomen was not tender.  The heart and lungs were normal.   HOSPITAL COURSE:  On the date of admission, the patient underwent  laparoscopy with extensive lysis of adhesions.  Upon attempt at doing  laparoscopic cholecystectomy, the patient was found to have an extremely  inflamed and scarred gallbladder.  The planes of dissection were extremely  hard to identify, and so the laparoscopic procedure was abandoned and I did  a midline incision through the incisional hernia for open procedure.  The  cholecystectomy was accomplished with a great deal of difficulty because of  the scarring and difficulty with dissection.  I did find what I thought was  the cystic duct but was unable to cannulate it for cholangiogram.  After  removal of the gallbladder I noticed a small amount of leakage  of bile and  dissected that out and found that there was an evident injury of the side of  the biliary duct. In order to prove the anatomy, I put a cholangiogram  catheter and with a balloon I made a cholangiogram, which showed that this  was an injury of the right hepatic duct.  There were no stones in the bile  duct, and the ducts were normal in caliber.  I treated the injury by  placement of a T-tube sutured in with a couple of sutures of 4-0 Vicryl, and  then the duct irrigated nicely.  I brought the T-tube out laterally and also  brought out a suction drain through a separate incision.  I repaired the  incisional hernia with a midline closure, making relaxing incisions in the  anterior rectus sheath to allow relief  of tension, as I did not feel it was  a good idea to use a mesh repair in this clinical situation.  Postoperatively the patient had a moderate ileus, which resolved nicely  prior to discharge.  He had no infectious complications.  The T-tube drained  bile well after an initial period of less than satisfactory drainage.  There  was some bilious drainage in the suction drain and serosanguineous drainage  in the subcutaneous drain for the hernia.  They were all draining in a  satisfactory way at the time of discharge.  The patient was discharged after  getting instructions for care of the drains, and he is to return to my  office in several days for attention to the drains and removal of his  staples.  My plan is to obtain a cholangiogram via the T-tube at that time  and after a satisfactory period of time in hopes that the T-tube can be  removed without further operative intervention.  The patient was eating well  and generally free of pain and feeling well at the time of discharge.  The  pathology showed chronic and acute cholecystitis and cholelithiasis.   DIAGNOSES:  1. Chronic and acute cholecystitis and cholelithiasis with history of common     duct stones.  2.  Incisional hernia.  3. Obesity.  4. Operative injury of the right hepatic duct.   OPERATIONS:  1. Cholecystectomy.  2. Operative cholangiogram.  3. T-tube drainage of the right hepatic duct.  4. Repair of incisional hernia.   CONDITION ON DISCHARGE:  Improved.                                               Lorre Munroe., M.D.    WB/MEDQ  D:  09/15/2003  T:  09/16/2003  Job:  [id number]

## 2010-07-16 NOTE — Assessment & Plan Note (Signed)
[facility]                              CARDIOLOGY OFFICE NOTE   GUNNAR, HEREFORD                       MRN:          [id number]  DATE:10/19/2005                            DOB:          1932-03-28    Mr. Klabunde is in for followup.  He really is feeling well.  He underwent 2-D  echo cardiogram today, and I reviewed this in detail with Dr. Myrtis Ser.  There  does not appear to be an active thrombus at the apex at this point in time.  Dr. Myrtis Ser felt that the finding could also be a calcified papillary muscle.  There is a little bit more motion at the apex, and he estimated his ejection  fraction at 40%.  Either way, the patient had declined implantation of an  ICD.  Fortunately, however, overall EF seems to be somewhat better since  undergoing revascularization surgery.  He denies any ongoing chest pain, and  his last LDL was 71.   PHYSICAL EXAMINATION:  VITAL SIGNS:  Blood pressure is 126/72, the pulse is  52.  CHEST:  The lung fields are clear.  CARDIAC: Rhythm is regular.   Overall, Mr. Hudnall is improved.  We have recommended that he discontinue  his Coumadin at the present time.  He will see me back in followup in  approximately 6 months.  He should remain on lipid-lowering therapy,  antiplatelet therapy and beta blockade at the current time.  He is also on  an ACE inhibitor.  Should he have any problems in the interim, he is to call  us.  Primary care followup will be with Dr. Artist Pais.                              Arturo Myricks. Riley Kill, MD, [facility]    TDS/MedQ  DD:  10/19/2005  DT:  10/19/2005  Job #:  [id number]   cc:   Barbette Hair. Artist Pais, DO

## 2010-07-16 NOTE — Cardiovascular Report (Signed)
NAME:  Jacob Costa, Jacob Costa NO.:  [id number]   MEDICAL RECORD NO.:  [id number]          PATIENT TYPE:  INP   LOCATION:  2910                         FACILITY:  [facility]   PHYSICIAN:  Arturo Tirado. Riley Kill, M.D. [facility] OF BIRTH:  1933/01/05   DATE OF PROCEDURE:  03/25/2005  DATE OF DISCHARGE:                              CARDIAC CATHETERIZATION   INDICATIONS:  Jacob Costa is a 77 year old gentleman who presents with chest  pain. He has ST elevation by EKG in the anterior precordial leads. He has  had no prior symptoms. He was seen by Dr. Graciela Husbands. The patient was elected to  enroll in the Horizons trial. He was brought to the catheterization  laboratory for urgent reperfusion therapy.   PROCEDURE:  1.  Left heart catheterization.  2.  Selective coronary arteriography.  3.  Selective left ventriculography.  4.  PTCA and stenting using nonDES technology in the left anterior      descending artery.   DESCRIPTION OF PROCEDURE:  The patient was brought to the cath lab, prepped  and draped in the usual fashion. Through an anterior puncture, the right  femoral artery was easily entered. A 6-French sheath was placed. Views of  the right and left coronary arteries were obtained in multiple angiographic  projections. Heparin was given according to protocol. Eptifibatide was also  administered. The patient was randomized to this arm in the Horizons trial.  Based on the coronary anatomy, we elected not to randomize in the stent as  it was felt that he would likely require revascularizations surgery. The  patient had a totally occluded LAD after the intermediate vessel. The  intermediate is a large-caliber vessel with 80% narrowing. The AV circumflex  has 90% narrowing. The right is small in caliber. Based on this information,  early reperfusion therapy was planned. A JL-4 guiding catheter was initially  utilized. With adequate anticoagulation, the wire was passed down into the  left  anterior descending artery. We then obtained labs through an I-stat.  The vessel was immediately dilated using a 2-mm balloon and the arrival time  in the laboratory to balloon dilatation was at 25 minutes. There was re-  establishment of TIMI II flow and then subsequently TIMI III flow.  Subsequently, we gave doses of intracoronary verapamil to improve tissue  perfusion. The vessel was dilated with a 2.5 mm balloon and then  subsequently stented with a 2.75 x 24 Liberte stent which was taken up to 14-  15 atmospheres. Following this, a 3.25 mm balloon was placed within the  confines of the stent, and a PowerSail was taken up to postdilate the stent.  Again additional doses of intracoronary verapamil were repeated. All  catheters then were subsequently removed and pigtail catheter placed in the  left ventricle. Ventriculography was performed in the RAO projection.  Following this, the pigtail was removed. The 6-French sheath was then  exchanged for a 7-French sheath, because of some oozing at the cath site.  There was a modest sized hematoma. The sheath was sewn into place. The  hematoma was monitored in the laboratory.  He was stable at time to be removed to the coronary care unit.   HEMODYNAMIC DATA:  1.  Central aortic pressure 130/86.  2.  Left ventricular pressure 120/14.  3.  No gradient on pullback across the aortic valve.   ANGIOGRAPHIC DATA:  1.  Ventriculography is done in the RAO projection. Ejection fraction was      calculated at 27%. There is mid-distal, anterolateral wall and apical      akinesis.  2.  The right coronary artery was a small-caliber vessel. This was tortuous.      It may have been diffusely constricted. There is 30-40% narrowing in the      midvessel. This vessel consists of a posterior descending and      posterolateral vessel.  3.  Left main has 30% narrowing at its distal most aspect  4.  The LAD provides an intermediate vessel and then is totally  occluded.      Following balloon dilatation over 24 mm area, the stenosis was reduced      to 0% with re-establishment of TIMI III flow. There is luminal      irregularities in the distal LAD.  5.  The large ramus intermedius has a hazy 80% stenosis leading into a large      bifurcation.  6.  The AV circumflex comes off at a 90 degree or greater than 90 degree      angle from the left main and has an ostial 90% stenosis that is moderate      size.   CONCLUSION:  1.  Acute anterior wall myocardial infarction.  2.  Total occlusion left anterior descending artery with successful      reperfusion by myocardial revascularization using a ballooning and      stenting with a nondrug-eluting stent technology   DISPOSITION:  The patient will be taken to the coronary care unit. We have  stopped his glycoprotein inhibitors in large part because of a moderate-  sized hematoma. He has been given 600 milligrams of oral clopidogrel in the  emergency room. We will have the surgeons review his films and we will  discuss other options including the possibility of dilating his intermediate  vessel. I will discuss this with my colleagues. He will be taken to the  coronary care unit.      Arturo Geurts. Riley Kill, M.D. [facility]  Electronically Signed     TDS/MEDQ  D:  03/25/2005  T:  03/26/2005  Job:  [id number]   cc:   Duke Salvia, M.D.  [address]  [address]  [place]  [place] [address]   CV Laboratory   Patient's medical record
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
## 2010-07-16 NOTE — Assessment & Plan Note (Signed)
[facility]                            CARDIOLOGY OFFICE NOTE   TULLIO, CHAUSSE                       MRN:          [id number]  DATE:03/17/2006                            DOB:          [birth year]    CARDIOLOGIST:  Arturo Vasil. Riley Kill, MD, [facility]   PRIMARY CARE PHYSICIAN:  Barbette Hair. Artist Pais, DO   HISTORY OF PRESENT ILLNESS:  Jacob Costa is a 77 year old male patient  followed by Dr. Riley Kill with a history of coronary artery disease status  post anterior ST elevation myocardial infarction in January of 2007 that  was treated emergently with a non drug eluting stent by Dr. Riley Kill.  The patient was then sent for bypass surgery which was performed by  Evelene Croon, M.D. on April 06, 2005.  The patient had a LIMA to the  LAD, vein graft to the intermediate coronary artery, and vein graft to  the obtuse marginal branch of the left circumflex performed.  Initially  his EF was 27%.  He had a follow-up echocardiogram in August of 2007  that revealed an improved EF to 40%.  The patient is part of the Horizon  study and is here for follow-up.  He denies any chest pain, shortness of  breath, syncope, or near syncope.   CURRENT MEDICATIONS:  1. Aspirin 81 mg daily.  2. Lopressor 25 mg twice daily.  3. Simvastatin 40 mg daily.  4. Altace 2.5 mg a day.  5. Tylox p.r.n.   ALLERGIES:  No known drug allergies.   PHYSICAL EXAMINATION:  GENERAL:  He is well-developed, well-nourished.  VITAL SIGNS:  Blood pressure 130/78, pulse 54, weight 222 pounds.  HEENT:  Head normocephalic and atraumatic.  Eyes PERRLA.  EOMI.  Sclerae  clear.  Oropharynx pink without exudate.  NECK:  Without JVD.  LYMPH:  Without lymphadenopathy.  HEART:  Normal S1 and S2.  Regular rate and rhythm without murmurs.  LUNGS:  Clear to auscultation bilaterally without wheezing, rhonchi, or  rales.  ABDOMEN:  Soft and nontender.  Normal active bowel sounds, no  organomegaly.  EXTREMITIES:   Without edema. Calves are soft and nontender.  NEUROLOGY:  He is alert and oriented x3.  Cranial nerves II-XII grossly  intact.   Electrocardiogram reveals sinus rhythm with heart rate of 60, T wave  inversions in 1 and aVL, and V3 through V6.  No significant change since  previous tracings.  Rare PVC.   IMPRESSION:  1. Coronary artery disease.      a.     Status post anterior ST-elevation myocardial infarction in       January of 2007 treated with bare metal stenting to the left       anterior descending.      b.     Status post coronary artery bypass graft in February of 2007       by Evelene Croon, M.D.  2. Ischemic cardiomyopathy with ejection fraction of 27%, improved to      40% by most recent echocardiogram.  3. Treated dyslipidemia.  4. Treated hypertension.  5. Horizon study.  PLAN:  The patient presents to the office today for follow-up for the  Horizon study.  From a cardiovascular standpoint, he is doing well.  I  will make sure he gets back to see Dr. Riley Kill in the next 2-3 months  for follow-up.  All of his medication will be continued the same at this  point in time.      Tereso Newcomer, PA-C  Electronically Signed      Noralyn Pick. Eden Emms, MD, [facility]  Electronically Signed   SW/MedQ  DD: 03/17/2006  DT: 03/17/2006  Job #: [id number]   cc:   Barbette Hair. Artist Pais, DO

## 2010-08-06 ENCOUNTER — Other Ambulatory Visit: Payer: Self-pay | Admitting: *Deleted

## 2010-08-06 MED ORDER — RAMIPRIL 2.5 MG PO CAPS: 2.5000 mg | ORAL_CAPSULE | Freq: Every day | ORAL | Status: DC

## 2010-08-31 ENCOUNTER — Telehealth: Payer: Self-pay

## 2010-08-31 NOTE — Telephone Encounter (Signed)
i called pt's wife - she was unable to speak as she said pt had just returned home but agrees to bring pt in for "check up" to review current issues - she verifies she and pt are safe at this time.  Please have our office call to arrange ROV for pt with me - thanks

## 2010-08-31 NOTE — Telephone Encounter (Signed)
Pt's spouse called stating pt's memory has been declining over the last 4-6 weeks and he has becomed increasingly difficult to live with. Pt is also very paranoid. Mrs Kothari is requesting a call back from Dr Felicity Coyer.

## 2010-08-31 NOTE — Telephone Encounter (Signed)
Pt. Is scheduled for Thurs. July 5.

## 2010-09-02 ENCOUNTER — Other Ambulatory Visit (INDEPENDENT_AMBULATORY_CARE_PROVIDER_SITE_OTHER): Payer: Medicare Other

## 2010-09-02 ENCOUNTER — Encounter: Payer: Self-pay | Admitting: Internal Medicine

## 2010-09-02 ENCOUNTER — Ambulatory Visit (INDEPENDENT_AMBULATORY_CARE_PROVIDER_SITE_OTHER): Payer: Medicare Other | Admitting: Internal Medicine

## 2010-09-02 VITALS — BP 130/72 | HR 46 | Temp 97.6°F | Ht 71.0 in | Wt 221.1 lb

## 2010-09-02 DIAGNOSIS — E538 Deficiency of other specified B group vitamins: Secondary | ICD-10-CM

## 2010-09-02 DIAGNOSIS — R350 Frequency of micturition: Secondary | ICD-10-CM

## 2010-09-02 DIAGNOSIS — F068 Other specified mental disorders due to known physiological condition: Secondary | ICD-10-CM

## 2010-09-02 LAB — CBC WITH DIFFERENTIAL/PLATELET
Eosinophils Relative: 4.2 % (ref 0.0–5.0)
HCT: 40.3 % (ref 39.0–52.0)
Hemoglobin: 13.5 g/dL (ref 13.0–17.0)
Lymphs Abs: 1.4 10*3/uL (ref 0.7–4.0)
MCV: 88.2 fl (ref 78.0–100.0)
Monocytes Absolute: 0.7 10*3/uL (ref 0.1–1.0)
Neutro Abs: 4 10*3/uL (ref 1.4–7.7)
Platelets: 177 10*3/uL (ref 150.0–400.0)
RDW: 13 % (ref 11.5–14.6)

## 2010-09-02 LAB — HEPATIC FUNCTION PANEL
AST: 24 U/L (ref 0–37)
Alkaline Phosphatase: 77 U/L (ref 39–117)
Total Bilirubin: 0.6 mg/dL (ref 0.3–1.2)

## 2010-09-02 LAB — POCT URINALYSIS DIPSTICK
Bilirubin, UA: NEGATIVE
Ketones, UA: NEGATIVE
Protein, UA: NEGATIVE
Spec Grav, UA: 1.002
pH, UA: 5

## 2010-09-02 LAB — BASIC METABOLIC PANEL
Calcium: 8.6 mg/dL (ref 8.4–10.5)
GFR: 73.89 mL/min (ref >60.00)
Potassium: 4.6 mEq/L (ref 3.5–5.1)
Sodium: 137 mEq/L (ref 135–145)

## 2010-09-02 MED ORDER — LORAZEPAM 0.5 MG PO TABS: 0.5000 mg | ORAL_TABLET | Freq: Three times a day (TID) | ORAL | Status: DC | PRN

## 2010-09-02 MED ORDER — MIRTAZAPINE 7.5 MG PO TABS: 7.5000 mg | ORAL_TABLET | Freq: Every day | ORAL | Status: DC

## 2010-09-02 NOTE — Patient Instructions (Signed)
It was good to see you today. Test(s) ordered today. Your results will be called to you after review (48-72hours after test completion). If any changes need to be made, you will be notified at that time. Start low dose remeron each night to help mood - also use lorazepam as needed (if needed) for irritability - Your prescription(s) have been submitted to your pharmacy. Please take as directed and contact our office if you believe you are having problem(s) with the medication(s). Please schedule followup in 6 weeks (august, not July); but call sooner if problems.

## 2010-09-02 NOTE — Progress Notes (Signed)
  Subjective:    Patient ID: Jacob Costa, male    DOB: 1932/11/28, 77 y.o.   MRN: [id number]  HPI Here for increase in irritability - doesn;t like to beleft alone "dependant on company" per wife associated with with poor sleep - prev 8-9h/night, now <6 - no daytime naps No chnage in meds, no falls ?depressed - very sad about unexpected death of niece, but not tearful On aricept without change for underlying dementia - current symptoms similar to mood prior to starting aricept Wife denies "dangereous" behavior or violence  Past Medical History  Diagnosis Date  . VITAMIN B12 DEFICIENCY 11/2009 dx  . OBESITY 06/27/2007  . CORONARY ATHEROSCLEROSIS NATIVE CORONARY ARTERY 02/2005    MI followed by CABGx3  . ISCHEMIC CARDIOMYOPATHY 01/13/2007  . PERCUTANEOUS TRANSLUMINAL CORONARY ANGIOPLASTY, HX OF 01/13/2007  . DEMENTIA 03/11/2010  . DYSLIPIDEMIA   . HYPERTENSION   . MYOCARDIAL INFARCTION 02/2005     Review of Systems  Constitutional: Positive for fatigue. Negative for fever.  Respiratory: Negative for shortness of breath.   Cardiovascular: Negative for chest pain.  Neurological: Negative for dizziness and headaches.       Objective:   Physical Exam BP 130/72  Pulse 46  Temp(Src) 97.6 F (36.4 C) (Oral)  Ht 5\' 11"  (1.803 m)  Wt 221 lb 1.9 oz (100.299 kg)  BMI 30.84 kg/m2  SpO2 97% Wt Readings from Last 3 Encounters:  09/02/10 221 lb 1.9 oz (100.299 kg)  06/10/10 229 lb 1.9 oz (103.928 kg)  03/11/10 232 lb 12.8 oz (105.597 kg)    Physical Exam  Constitutional:  oriented to person, place, and time. appears well-developed and well-nourished. No distress. Wife at side Neck: Normal range of motion. Neck supple. No JVD present. No thyromegaly present.  Cardiovascular: Normal rate, regular rhythm and normal heart sounds.  No murmur heard. no BLE edema Pulmonary/Chest: Effort normal and breath sounds normal. No respiratory distress. no wheezes.  Neurological: he is alert and  oriented to person, place, and time. No cranial nerve deficit. Coordination normal.  Psychiatric: he has a depressed mood and affect. behavior is reserved. Judgment and thought content normal.       Lab Results  Component Value Date   WBC 6.9 06/10/2010   HGB 13.6 06/10/2010   HCT 40.4 06/10/2010   PLT 164.0 06/10/2010   CHOL 131 09/01/2009   TRIG 81.0 09/01/2009   HDL 41.40 09/01/2009   ALT 13 09/01/2009   AST 19 09/01/2009   NA 142 06/11/2009   K 4.5 06/11/2009   CL 103 06/11/2009   CREATININE 1.1 06/11/2009   BUN 10 06/11/2009   CO2 31 06/11/2009   TSH 1.86 12/10/2009   INR 0.9 09/03/2008   HGBA1C 5.9 07/02/2007   Lab Results  Component Value Date   VITAMINB12 840 06/10/2010   Lab Results  Component Value Date   FOLATE 13.0 06/10/2010     Assessment & Plan:  See problem list. Medications and labs reviewed today.

## 2010-09-02 NOTE — Assessment & Plan Note (Signed)
Dx 11/2009 witheval for memory loss status post one IM replacement but has opted for oral supplements since -  Recheck now - if normal, no change needed; if low, resume IM replacement 05/2010 CBC and folate ok

## 2010-09-02 NOTE — Assessment & Plan Note (Signed)
Started on Aricept 11/2009 and dose increase 02/2010 - also ongoing B12 replacement (oral - see next) MRI brain 11/2009 unremarkable Symptoms had improved per wife and the patient - now decline in past 3 weeks with increase irritability and poor sleep rule out underlying medical issue or UTI - labs ordered today Add remeron for sleep and depression issues - also ativan prn irritable outbursts - new rx done follow up 6 weeks, sooner if problems

## 2010-09-04 LAB — URINE CULTURE: Colony Count: 4000

## 2010-09-13 ENCOUNTER — Encounter: Payer: Self-pay | Admitting: Cardiology

## 2010-09-13 ENCOUNTER — Ambulatory Visit (INDEPENDENT_AMBULATORY_CARE_PROVIDER_SITE_OTHER): Payer: Medicare Other | Admitting: Cardiology

## 2010-09-13 DIAGNOSIS — I2510 Atherosclerotic heart disease of native coronary artery without angina pectoris: Secondary | ICD-10-CM

## 2010-09-13 DIAGNOSIS — I10 Essential (primary) hypertension: Secondary | ICD-10-CM

## 2010-09-13 DIAGNOSIS — E785 Hyperlipidemia, unspecified: Secondary | ICD-10-CM

## 2010-09-13 DIAGNOSIS — I2589 Other forms of chronic ischemic heart disease: Secondary | ICD-10-CM

## 2010-09-23 ENCOUNTER — Ambulatory Visit: Payer: No Typology Code available for payment source | Admitting: Internal Medicine

## 2010-10-14 ENCOUNTER — Other Ambulatory Visit (INDEPENDENT_AMBULATORY_CARE_PROVIDER_SITE_OTHER): Payer: Medicare Other

## 2010-10-14 ENCOUNTER — Ambulatory Visit (INDEPENDENT_AMBULATORY_CARE_PROVIDER_SITE_OTHER): Payer: Medicare Other | Admitting: Internal Medicine

## 2010-10-14 ENCOUNTER — Encounter: Payer: Self-pay | Admitting: Internal Medicine

## 2010-10-14 DIAGNOSIS — I10 Essential (primary) hypertension: Secondary | ICD-10-CM

## 2010-10-14 DIAGNOSIS — E785 Hyperlipidemia, unspecified: Secondary | ICD-10-CM

## 2010-10-14 DIAGNOSIS — I2510 Atherosclerotic heart disease of native coronary artery without angina pectoris: Secondary | ICD-10-CM

## 2010-10-14 DIAGNOSIS — F068 Other specified mental disorders due to known physiological condition: Secondary | ICD-10-CM

## 2010-10-14 DIAGNOSIS — Z23 Encounter for immunization: Secondary | ICD-10-CM

## 2010-10-14 LAB — LIPID PANEL
Cholesterol: 124 mg/dL (ref 0–200)
HDL: 43.7 mg/dL (ref >39.00)
LDL Cholesterol: 61 mg/dL (ref 0–99)
Triglycerides: 95 mg/dL (ref 0.0–149.0)
VLDL: 19 mg/dL (ref 0.0–40.0)

## 2010-10-14 MED ORDER — PNEUMOCOCCAL VAC POLYVALENT 25 MCG/0.5ML IJ INJ: 0.5000 mL | INJECTION | Freq: Once | INTRAMUSCULAR | Status: DC

## 2010-10-14 MED ORDER — DONEPEZIL HCL 10 MG PO TABS: 10.0000 mg | ORAL_TABLET | Freq: Every day | ORAL | Status: DC

## 2010-10-14 NOTE — Progress Notes (Signed)
Subjective:    Patient ID: Jacob Costa, male    DOB: 02/07/1933, 77 y.o.   MRN: [id number]  HPI   here for follow up - reviewed chronic medical issues:  dementia  - eval 11/2009 for memory loss - problem noted by wife:short term memory trouble - irritability and "neediness" unable to find things that he placed away, occ driving trouble (forgets directions) no HA or weakness; no vision change or trouble speaking, no balance trouble or falls MRI brain 11/2009 neg, started aricept (and b12 shots) - doing well - dose aricept increased 02/2010 Then added remeron 08/2010 due to sleep problems and short temper following death of niece. reports compliance with ongoing medical treatment and no changes in medication dose or frequency. denies adverse side effects related to current therapy.  improved mood and less irritability per the patient and wife  hypertension - no problems with current meds - no CP, HA, LE swelling or SOB; no cough  dyslipidemia - on statin -no problems on current meds but ?effect on memory problems? no muscle pains or GI upset  CAD - repeat cath 7/10 by dr. Riley Kill - no critical dz found s/p CABG following MI 1/07 no recurrent symptoms or active angina  B12 defic - dx 11/2009 -  Started on shots for same, but opted to change to oral therapy - doing well - see dementia above   Past Medical History  Diagnosis Date  . VITAMIN B12 DEFICIENCY 11/2009 dx  . CORONARY ATHEROSCLEROSIS NATIVE CORONARY ARTERY 02/2005    MI followed by CABGx3  . DYSLIPIDEMIA   . HYPERTENSION   . MYOCARDIAL INFARCTION 02/2005  . OBESITY   . DEMENTIA   . ISCHEMIC CARDIOMYOPATHY   . PERCUTANEOUS TRANSLUMINAL CORONARY ANGIOPLASTY, HX OF      Review of Systems  Constitutional: Negative for fatigue and unexpected weight change.  Respiratory: Negative for shortness of breath.   Cardiovascular: Negative for chest pain.  Psychiatric/Behavioral: Negative for hallucinations, dysphoric mood and  agitation.       Objective:   Physical Exam  Blood pressure 120/62, pulse 46, temperature 97.9 F (36.6 C), temperature source Oral, height 5\' 11"  (1.803 m), weight 222 lb 1.9 oz (100.753 kg), SpO2 97.00%. Wt Readings from Last 3 Encounters:  10/14/10 222 lb 1.9 oz (100.753 kg)  09/13/10 221 lb 12.8 oz (100.608 kg)  09/02/10 221 lb 1.9 oz (100.299 kg)   Constitutional:  oriented to person, place, and time. appears well-developed and well-nourished. No distress. Wife at side. Cardiovascular: Mild brady rate, regular rhythm and normal heart sounds.  No murmur heard. no BLE edema Pulmonary/Chest: Effort normal and breath sounds normal. No respiratory distress. no wheezes.  Neurological: he is alert and oriented to person, place, and time. No cranial nerve deficit. Coordination normal. Good attention and recall. Psyc: pleaseant mood and normal affect. Quiet and reserved but interactive when asked to participate.      Lab Results  Component Value Date   WBC 6.4 09/02/2010   HGB 13.5 09/02/2010   HCT 40.3 09/02/2010   PLT 177.0 09/02/2010   CHOL 131 09/01/2009   TRIG 81.0 09/01/2009   HDL 41.40 09/01/2009   ALT 18 09/02/2010   AST 24 09/02/2010   NA 137 09/02/2010   K 4.6 09/02/2010   CL 101 09/02/2010   CREATININE 1.0 09/02/2010   BUN 12 09/02/2010   CO2 30 09/02/2010   TSH 2.34 09/02/2010   INR 0.9 09/03/2008   HGBA1C 5.9 07/02/2007  Lab Results  Component Value Date   VITAMINB12 957* 09/02/2010                                       164                                                                 12/15/2009  Assessment & Plan:  See problem list. Medications and labs reviewed today.

## 2010-10-14 NOTE — Assessment & Plan Note (Signed)
note stable bradycardia on current Bbloc dose The current medical regimen is effective;  continue present plan and medications.  BP Readings from Last 3 Encounters:  10/14/10 120/62  09/13/10 141/77  09/02/10 130/72

## 2010-10-14 NOTE — Patient Instructions (Signed)
It was good to see you today. Pneumonia shot today - let us know about the shingles shot we talked about Test(s) ordered today. Your results will be called to you after review (48-72hours after test completion). If any changes need to be made, you will be notified at that time. Reduce simvastatin to 20mg  daily (1/2 of 40mg  pill) - other Medications reviewed, no other changes at this time. Please schedule followup in 4 months for blood pressure and mood/memory check, call sooner if problems.

## 2010-10-14 NOTE — Assessment & Plan Note (Signed)
MI followed by CABG x 3 02/2005 No anginal symptoms  Cath 08/2008 without change -  The current medical regimen is effective;  Reduce simva dose by 1/2 due to memory issues and good LDL control (recheck now). prev followed with cards annually for same but will now follow here

## 2010-10-14 NOTE — Assessment & Plan Note (Addendum)
Started on Aricept 11/2009; dose increase 02/2010 - also ongoing B12 replacement (oral) MRI brain 11/2009 unremarkable Added remeron 08/2010 for sleep and depression issues - much improved No need for ativan prn irritable outbursts since adding remeron  The current medical regimen is effective;  continue present plan and medications.

## 2010-10-14 NOTE — Assessment & Plan Note (Signed)
Will reduce simva to 20mg  qhs - check lipids now and monitor

## 2010-10-19 NOTE — Assessment & Plan Note (Signed)
He has remained stable.  See data from 2010 cath, done for an abnormal ECG.  Has non specific ST and T changes.  Will continue to follow up.
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
## 2010-10-19 NOTE — Assessment & Plan Note (Signed)
Has known EF of 35%.  Has had very little interest in pursuing the concept of an ICD.  He has been stable.  See prior notes on this topic.  He just wants to remain on medical therapy.

## 2010-10-19 NOTE — Assessment & Plan Note (Signed)
Under reasonable control.  Would defer any change in meds to his primary provider.  ACE could be increased easily.

## 2010-10-19 NOTE — Assessment & Plan Note (Addendum)
Has been pretty good. Probably appropriate to recheck lipid and liver profile.

## 2010-11-23 ENCOUNTER — Other Ambulatory Visit: Payer: Self-pay | Admitting: Internal Medicine

## 2010-11-24 ENCOUNTER — Other Ambulatory Visit: Payer: Self-pay | Admitting: Internal Medicine

## 2011-01-08 ENCOUNTER — Other Ambulatory Visit: Payer: Self-pay | Admitting: Internal Medicine

## 2011-01-10 NOTE — Telephone Encounter (Signed)
Remeron request [last refill 07.05.12 #30x3 last OV 08.16.12]

## 2011-01-10 NOTE — Telephone Encounter (Signed)
Done by MD.

## 2011-02-14 ENCOUNTER — Ambulatory Visit: Payer: Medicare Other | Admitting: Internal Medicine

## 2011-03-29 ENCOUNTER — Other Ambulatory Visit: Payer: Self-pay | Admitting: *Deleted

## 2011-03-29 MED ORDER — RAMIPRIL 2.5 MG PO CAPS: 2.5000 mg | ORAL_CAPSULE | Freq: Every day | ORAL | Status: DC

## 2011-04-06 ENCOUNTER — Other Ambulatory Visit (INDEPENDENT_AMBULATORY_CARE_PROVIDER_SITE_OTHER): Payer: Medicare Other

## 2011-04-06 ENCOUNTER — Ambulatory Visit (INDEPENDENT_AMBULATORY_CARE_PROVIDER_SITE_OTHER): Payer: Medicare Other | Admitting: Internal Medicine

## 2011-04-06 ENCOUNTER — Encounter: Payer: Self-pay | Admitting: Internal Medicine

## 2011-04-06 DIAGNOSIS — F068 Other specified mental disorders due to known physiological condition: Secondary | ICD-10-CM

## 2011-04-06 DIAGNOSIS — E538 Deficiency of other specified B group vitamins: Secondary | ICD-10-CM

## 2011-04-06 DIAGNOSIS — I10 Essential (primary) hypertension: Secondary | ICD-10-CM

## 2011-04-06 LAB — CBC WITH DIFFERENTIAL/PLATELET
Basophils Absolute: 0.1 10*3/uL (ref 0.0–0.1)
Eosinophils Absolute: 0.2 10*3/uL (ref 0.0–0.7)
Hemoglobin: 13.2 g/dL (ref 13.0–17.0)
Lymphocytes Relative: 22.5 % (ref 12.0–46.0)
MCHC: 33.8 g/dL (ref 30.0–36.0)
Neutro Abs: 4.2 10*3/uL (ref 1.4–7.7)
Platelets: 184 10*3/uL (ref 150.0–400.0)
RDW: 13.5 % (ref 11.5–14.6)

## 2011-04-06 LAB — VITAMIN B12: Vitamin B-12: 1317 pg/mL — ABNORMAL HIGH (ref 211–911)

## 2011-04-06 NOTE — Assessment & Plan Note (Signed)
Dx 11/2009 witheval for memory loss status post one IM replacement but has opted for oral supplements since -  Recheck now - if normal, no change needed; if low, resume IM replacement 05/2010 CBC and folate ok   

## 2011-04-06 NOTE — Progress Notes (Signed)
Subjective:    Patient ID: Jacob Costa, male    DOB: [birth year], 78 y.o.   MRN: [id number]  HPI   here for follow up - reviewed chronic medical issues:  dementia  - eval 11/2009 for memory loss - problem noted by wife:short term memory trouble - irritability and "neediness" unable to find things that he placed away, occ driving trouble (forgets directions) no HA or weakness; no vision change or trouble speaking, no balance trouble or falls MRI brain 11/2009 neg, started aricept (and b12 shots) - doing well - dose aricept increased 02/2010 Then added remeron 08/2010 due to sleep problems and short temper following death of niece. reports compliance with ongoing medical treatment and no changes in medication dose or frequency. denies adverse side effects related to current therapy.  improved mood and less irritability per the patient and wife  hypertension - no problems with current meds - no CP, HA, LE swelling or SOB; no cough  dyslipidemia - on statin -no problems on current meds but ?effect on memory problems? no muscle pains or GI upset  CAD - repeat cath 7/10 by dr. Riley Kill - no critical dz found s/p CABG following MI 1/07 no recurrent symptoms or active angina  B12 defic - dx 11/2009 -  Started on shots for same, but opted to change to oral therapy - doing well - see dementia above   Past Medical History  Diagnosis Date  . VITAMIN B12 DEFICIENCY 11/2009 dx  . CORONARY ATHEROSCLEROSIS NATIVE CORONARY ARTERY 02/2005    MI followed by CABGx3  . DYSLIPIDEMIA   . HYPERTENSION   . MYOCARDIAL INFARCTION 02/2005  . OBESITY   . DEMENTIA   . ISCHEMIC CARDIOMYOPATHY   . PERCUTANEOUS TRANSLUMINAL CORONARY ANGIOPLASTY, HX OF      Review of Systems  Constitutional: Negative for fatigue and unexpected weight change.  Respiratory: Negative for shortness of breath.   Cardiovascular: Negative for chest pain.  Psychiatric/Behavioral: Negative for hallucinations, dysphoric mood and  agitation.       Objective:   Physical Exam BP 144/62  Pulse 44  Temp(Src) 97 F (36.1 C) (Oral)  Wt 235 lb 12.8 oz (106.958 kg)  SpO2 97%  Wt Readings from Last 3 Encounters:  04/06/11 235 lb 12.8 oz (106.958 kg)  10/14/10 222 lb 1.9 oz (100.753 kg)  09/13/10 221 lb 12.8 oz (100.608 kg)   Constitutional:  He appears well-developed and well-nourished. No distress. Wife at side. Cardiovascular: Mild brady rate, regular rhythm and normal heart sounds.  No murmur heard. no BLE edema Pulmonary/Chest: Effort normal and breath sounds normal. No respiratory distress. no wheezes.  Neurological: he is alert and oriented to person, place, and time. No cranial nerve deficit. Coordination normal. Good attention and recall. Psyc: pleaseant mood and normal affect. Quiet and reserved but interactive when asked to participate.      Lab Results  Component Value Date   WBC 6.4 09/02/2010   HGB 13.5 09/02/2010   HCT 40.3 09/02/2010   PLT 177.0 09/02/2010   CHOL 124 10/14/2010   TRIG 95.0 10/14/2010   HDL 43.70 10/14/2010   ALT 18 09/02/2010   AST 24 09/02/2010   NA 137 09/02/2010   K 4.6 09/02/2010   CL 101 09/02/2010   CREATININE 1.0 09/02/2010   BUN 12 09/02/2010   CO2 30 09/02/2010   TSH 2.34 09/02/2010   INR 0.9 09/03/2008   HGBA1C 5.9 07/02/2007   Lab Results  Component Value Date  [id number] 957* 09/02/2010                                       164                                                                 12/15/2009  Assessment & Plan:  See problem list. Medications and labs reviewed today.

## 2011-04-06 NOTE — Assessment & Plan Note (Signed)
note stable bradycardia on current Bbloc dose The current medical regimen is effective;  continue present plan and medications.  BP Readings from Last 3 Encounters:  04/06/11 144/62  10/14/10 120/62  09/13/10 141/77

## 2011-04-06 NOTE — Patient Instructions (Signed)
It was good to see you today. Test(s) ordered today. Your results will be called to you after review (48-72hours after test completion). If any changes need to be made, you will be notified at that time. Medications reviewed, no changes at this time. Please schedule followup in 4 months for blood pressure and mood/memory check, call sooner if problems.

## 2011-04-06 NOTE — Assessment & Plan Note (Signed)
Started on Aricept 11/2009; dose increase 02/2010 - also ongoing B12 replacement (oral) MRI brain 11/2009 unremarkable Added remeron 08/2010 for sleep and depression issues - much improved, but weight gain and sedation reviewed with wife No need for ativan prn irritable outbursts since adding remeron  The current medical regimen is effective;  continue present plan and medications.

## 2011-04-18 ENCOUNTER — Other Ambulatory Visit: Payer: Self-pay | Admitting: Internal Medicine

## 2011-05-16 ENCOUNTER — Other Ambulatory Visit: Payer: Self-pay | Admitting: Internal Medicine

## 2011-05-23 ENCOUNTER — Other Ambulatory Visit: Payer: Self-pay | Admitting: Internal Medicine

## 2011-07-01 ENCOUNTER — Emergency Department (HOSPITAL_COMMUNITY): Payer: Medicare Other

## 2011-07-01 ENCOUNTER — Emergency Department (HOSPITAL_COMMUNITY)
Admission: EM | Admit: 2011-07-01 | Discharge: 2011-07-01 | Disposition: A | Discharge status: Home / Self Care | Payer: Medicare Other | Attending: Emergency Medicine | Admitting: Emergency Medicine

## 2011-07-01 ENCOUNTER — Encounter (HOSPITAL_COMMUNITY): Payer: Self-pay | Admitting: Emergency Medicine

## 2011-07-01 DIAGNOSIS — S82843A Displaced bimalleolar fracture of unspecified lower leg, initial encounter for closed fracture: Secondary | ICD-10-CM | POA: Insufficient documentation

## 2011-07-01 DIAGNOSIS — Y93H2 Activity, gardening and landscaping: Secondary | ICD-10-CM | POA: Insufficient documentation

## 2011-07-01 DIAGNOSIS — W010XXA Fall on same level from slipping, tripping and stumbling without subsequent striking against object, initial encounter: Secondary | ICD-10-CM | POA: Insufficient documentation

## 2011-07-01 DIAGNOSIS — M25579 Pain in unspecified ankle and joints of unspecified foot: Secondary | ICD-10-CM | POA: Insufficient documentation

## 2011-07-01 MED ORDER — HYDROCODONE-ACETAMINOPHEN 5-325 MG PO TABS: 1.0000 | ORAL_TABLET | Freq: Four times a day (QID) | ORAL | Status: DC | PRN

## 2011-07-01 NOTE — ED Provider Notes (Signed)
Patient seen by Dr. Shon Baton.  Splint has been applied, crutches provided.  Patient will be discharged home with follow-up with Dr. Victorino Dike on Monday to schedule surgery.  Jimmye Norman, NP 07/01/11 1610

## 2011-07-01 NOTE — ED Notes (Signed)
Pt c/o left ankle pain after slipping while mowing grass; pt noted to have swelling to left ankle; pt denies any blood thinners or other injury

## 2011-07-01 NOTE — ED Notes (Addendum)
Pt has splint applied to left leg, wrapped with ACE wrap. Pt can move left leg, sensation intact. Pt notified waiting for orthopedic surgeon to come consult patient.

## 2011-07-01 NOTE — ED Notes (Signed)
Dr. Shon Baton in to assess and talk with pt.

## 2011-07-01 NOTE — ED Notes (Signed)
Hurt left ankle while mowing grass slipped on wet grass

## 2011-07-01 NOTE — Discharge Instructions (Signed)
Ankle Fracture A fracture is a break in the bone. A cast or splint is used to protect and keep your injured bone from moving.  HOME CARE INSTRUCTIONS   Use your crutches as directed.   To lessen the swelling, keep the injured leg elevated while sitting or lying down.   Apply ice to the injury for 15 to 20 minutes, 3 to 4 times per day while awake for 2 days. Put the ice in a plastic bag and place a thin towel between the bag of ice and your cast.   If you have a plaster or fiberglass cast:   Do not try to scratch the skin under the cast using sharp or pointed objects.   Check the skin around the cast every day. You may put lotion on any red or sore areas.   Keep your cast dry and clean.   If you have a plaster splint:   Wear the splint as directed.   You may loosen the elastic around the splint if your toes become numb, tingle, or turn cold or blue.   Do not put pressure on any part of your cast or splint; it may break. Rest your cast only on a pillow the first 24 hours until it is fully hardened.   Your cast or splint can be protected during bathing with a plastic bag. Do not lower the cast or splint into water.   Take medications as directed by your caregiver. Only take over-the-counter or prescription medicines for pain, discomfort, or fever as directed by your caregiver.   Do not drive a vehicle until your caregiver specifically tells you it is safe to do so.   If your caregiver has given you a follow-up appointment, it is very important to keep that appointment. Not keeping the appointment could result in a chronic or permanent injury, pain, and disability. If there is any problem keeping the appointment, you must call back to this facility for assistance.  SEEK IMMEDIATE MEDICAL CARE IF:   Your cast gets damaged or breaks.   You have continued severe pain or more swelling than you did before the cast was put on.   Your skin or toenails below the injury turn blue or gray,  or feel cold or numb.   There is a bad smell or new stains and/or purulent (pus like) drainage coming from under the cast.  If you do not have a window in your cast for observing the wound, a discharge or minor bleeding may show up as a stain on the outside of your cast. Report these findings to your caregiver. MAKE SURE YOU:   Understand these instructions.   Will watch your condition.   Will get help right away if you are not doing well or get worse.  Document Released: 02/12/2000 Document Revised: 02/03/2011 Document Reviewed: 09/18/2007 [facility] Patient Information 2012 [place], [place].   Crutch Use You have been prescribed crutches to take weight off one of your lower legs or feet (extremities). When using crutches, make sure you are not putting pressure on the armpit (axilla). This could cause damage to the nerves that extend from your axilla to the hand and arm. When fitted properly the crutches should be 2 to 3 finger widths below the axilla. Your weight should be supported by your hand, and not by resting upon the crutch with the axilla. When walking, first step with the crutches, then swing the healthy leg through and slightly ahead. When going up stairs, first  step up with the healthy leg and then follow with the crutches and injured leg up to the same step, and so forth. If there is a handrail, hold both crutches in one hand, place your other hand on the handrail, and while placing your weight on your arms, lift your good leg to the step, then bring the crutches and the injured leg up to that step. Repeat for each step. When going down stairs, first step with the injured leg and crutches, following down with the healthy leg to the same step. Be very careful, as going down stairs with crutches is very challenging. If you feel wobbly or nervous, sit down and inch yourself down the stairs on your butt. To get up from a chair, hold injured leg forward, grab armrest with one hand and the  top of the crutches with the other hand. Using these supports, pull yourself up to a standing position. Reverse this procedure for sitting. See your caregiver for follow up as suggested. If you are discharged in an ace wrap and develop numbness, tingling, swelling, or increased pain, loosen the ace wrap and re-wrap looser. If these problems persist, see your caregiver as needed. If you have been instructed to use partial weight bearing, bear (apply) the amount of weight as suggested by your caregiver. Do not bear weight in an amount that causes pain on the area of injury. Document Released: 02/12/2000 Document Revised: 02/03/2011 Document Reviewed: 04/21/2008 [facility] Patient Information 2012 [place], [place].  FOLLOW UP WITH DR HEWITT ON Monday, 07/04/11, AS DISCUSSED.

## 2011-07-01 NOTE — Consult Note (Signed)
Rene Paci, MD, MD Chief Complaint: Left ankle fracture  History: Pleasant young man s/p fall with inability to weight bear on left leg.  Presents to ER with deformity and pain. Xrays demonstrate bimalleolar ankle fracture with wide mortise.  Called to evaluate and treat,   Past Medical History  Diagnosis Date  . VITAMIN B12 DEFICIENCY 11/2009 dx  . CORONARY ATHEROSCLEROSIS NATIVE CORONARY ARTERY 02/2005    MI followed by CABGx3  . DYSLIPIDEMIA   . HYPERTENSION   . MYOCARDIAL INFARCTION 02/2005  . OBESITY   . DEMENTIA   . ISCHEMIC CARDIOMYOPATHY   . PERCUTANEOUS TRANSLUMINAL CORONARY ANGIOPLASTY, HX OF     No Known Allergies  No current facility-administered medications on file prior to encounter.   Current Outpatient Prescriptions on File Prior to Encounter  Medication Sig Dispense Refill  . aspirin 81 MG tablet Take 81 mg by mouth daily.        Marland Kitchen donepezil (ARICEPT) 10 MG tablet TAKE 1 TABLET (10 MG TOTAL) BY MOUTH AT BEDTIME.  30 tablet  5  . metoprolol tartrate (LOPRESSOR) 25 MG tablet TAKE 1 TABLET BY MOUTH TWICE A DAY  60 tablet  5  . ramipril (ALTACE) 2.5 MG capsule Take 1 capsule (2.5 mg total) by mouth daily.  30 capsule  6  . simvastatin (ZOCOR) 40 MG tablet Take 0.5 tablets (20 mg total) by mouth at bedtime.  30 tablet  3  . vitamin B-12 (CYANOCOBALAMIN) 1000 MCG tablet Take 1,000 mcg by mouth daily.        Marland Kitchen LORazepam (ATIVAN) 0.5 MG tablet Take 1 tablet (0.5 mg total) by mouth every 8 (eight) hours as needed for anxiety.  30 tablet  0  . mirtazapine (REMERON) 7.5 MG tablet TAKE 1 TABLET BY MOUTH AT BEDTIME.  30 tablet  3  . DISCONTD: LORazepam (ATIVAN) 0.5 MG tablet Take 0.5 mg by mouth every 8 (eight) hours as needed.          Physical Exam: Filed Vitals:   07/01/11 1454  BP: 156/69  Pulse: 66  Temp:   Resp: 18   A+O X3 No sob/cp abd soft/nt By report closed injury.  ankle currently in splint NVI - cap refill <2 sec.  Sensation intact.   No knee  pain. No right LE complaints Splint in good condition Foot in neutral position  Image: Dg Ankle Complete Left  07/01/2011  *RADIOLOGY REPORT*  Clinical Data: Fall, pain, swelling.  LEFT ANKLE COMPLETE - 3+ VIEW  Comparison: None.  Findings: There is a bimalleolar fracture.  Oblique fracture through the distal fibula.  Transverse fracture through the medial malleolus.  Disruption of the ankle mortise with widening medially. Diffuse soft tissue swelling.  IMPRESSION: Bimalleolar fracture with widening of the medial ankle mortise.  Original Report Authenticated By: Cyndie Chime, M.D.    A/P: S/P fall with close left bimalleolar fracture. Spoke with Dr Victorino Dike - will see patient Monday afternoon to discuss ORIF Will need fixation of fracture  Discussed with patient and wife - in agreement with plan Wife will call office Monday AM to arrange afternoon evaluation - [phone] Norco for pain Elevate and ice left ankle Strict NWB on left LE Return to Er over week-end if problems arise

## 2011-07-04 ENCOUNTER — Encounter (HOSPITAL_COMMUNITY): Payer: Self-pay | Admitting: *Deleted

## 2011-07-04 ENCOUNTER — Encounter (HOSPITAL_COMMUNITY): Payer: Self-pay | Admitting: Pharmacy Technician

## 2011-07-05 ENCOUNTER — Ambulatory Visit (HOSPITAL_COMMUNITY): Payer: Medicare Other | Admitting: Anesthesiology

## 2011-07-05 ENCOUNTER — Encounter (HOSPITAL_COMMUNITY)
Admission: RE | Disposition: A | Discharge status: Skilled Nursing Facility | Payer: Self-pay | Source: Ambulatory Visit | Attending: Orthopedic Surgery

## 2011-07-05 ENCOUNTER — Encounter (HOSPITAL_COMMUNITY): Payer: Self-pay | Admitting: Anesthesiology

## 2011-07-05 ENCOUNTER — Ambulatory Visit (HOSPITAL_COMMUNITY): Payer: Medicare Other

## 2011-07-05 ENCOUNTER — Inpatient Hospital Stay (HOSPITAL_COMMUNITY)
Admission: RE | Admit: 2011-07-05 | Discharge: 2011-07-08 | DRG: 494 | Disposition: A | Discharge status: Skilled Nursing Facility | Payer: Medicare Other | Source: Ambulatory Visit | Attending: Orthopedic Surgery | Admitting: Orthopedic Surgery

## 2011-07-05 ENCOUNTER — Encounter (HOSPITAL_COMMUNITY): Payer: Self-pay | Admitting: *Deleted

## 2011-07-05 DIAGNOSIS — I2589 Other forms of chronic ischemic heart disease: Secondary | ICD-10-CM | POA: Diagnosis present

## 2011-07-05 DIAGNOSIS — W19XXXA Unspecified fall, initial encounter: Secondary | ICD-10-CM | POA: Diagnosis present

## 2011-07-05 DIAGNOSIS — Z951 Presence of aortocoronary bypass graft: Secondary | ICD-10-CM

## 2011-07-05 DIAGNOSIS — F0391 Unspecified dementia with behavioral disturbance: Secondary | ICD-10-CM | POA: Insufficient documentation

## 2011-07-05 DIAGNOSIS — Z6833 Body mass index (BMI) 33.0-33.9, adult: Secondary | ICD-10-CM

## 2011-07-05 DIAGNOSIS — R410 Disorientation, unspecified: Secondary | ICD-10-CM | POA: Diagnosis present

## 2011-07-05 DIAGNOSIS — I2510 Atherosclerotic heart disease of native coronary artery without angina pectoris: Secondary | ICD-10-CM | POA: Diagnosis present

## 2011-07-05 DIAGNOSIS — E785 Hyperlipidemia, unspecified: Secondary | ICD-10-CM | POA: Diagnosis present

## 2011-07-05 DIAGNOSIS — I252 Old myocardial infarction: Secondary | ICD-10-CM

## 2011-07-05 DIAGNOSIS — E669 Obesity, unspecified: Secondary | ICD-10-CM | POA: Diagnosis present

## 2011-07-05 DIAGNOSIS — Z7982 Long term (current) use of aspirin: Secondary | ICD-10-CM

## 2011-07-05 DIAGNOSIS — F0390 Unspecified dementia without behavioral disturbance: Secondary | ICD-10-CM | POA: Diagnosis present

## 2011-07-05 DIAGNOSIS — Z9861 Coronary angioplasty status: Secondary | ICD-10-CM

## 2011-07-05 DIAGNOSIS — Z79899 Other long term (current) drug therapy: Secondary | ICD-10-CM

## 2011-07-05 DIAGNOSIS — I10 Essential (primary) hypertension: Secondary | ICD-10-CM | POA: Diagnosis present

## 2011-07-05 DIAGNOSIS — S82843A Displaced bimalleolar fracture of unspecified lower leg, initial encounter for closed fracture: Principal | ICD-10-CM | POA: Diagnosis present

## 2011-07-05 DIAGNOSIS — R404 Transient alteration of awareness: Secondary | ICD-10-CM | POA: Diagnosis present

## 2011-07-05 HISTORY — PX: EXTERNAL FIXATION LEG: SHX1549

## 2011-07-05 LAB — BASIC METABOLIC PANEL
Chloride: 100 mEq/L (ref 96–112)
Creatinine, Ser: 1 mg/dL (ref 0.50–1.35)
GFR calc Af Amer: 82 mL/min — ABNORMAL LOW (ref >90)
Potassium: 4.6 mEq/L (ref 3.5–5.1)

## 2011-07-05 LAB — CBC
HCT: 39.6 % (ref 39.0–52.0)
Hemoglobin: 13 g/dL (ref 13.0–17.0)
RDW: 13.1 % (ref 11.5–15.5)
WBC: 8.9 10*3/uL (ref 4.0–10.5)

## 2011-07-05 LAB — SURGICAL PCR SCREEN
MRSA, PCR: NEGATIVE
Staphylococcus aureus: NEGATIVE

## 2011-07-05 SURGERY — EXTERNAL FIXATION, LOWER EXTREMITY
Anesthesia: General | Site: Ankle | Laterality: Left | Wound class: Clean

## 2011-07-05 MED ORDER — CEFAZOLIN SODIUM-DEXTROSE 2-3 GM-% IV SOLR
INTRAVENOUS | Status: AC
  Filled 2011-07-05: qty 50

## 2011-07-05 MED ORDER — DONEPEZIL HCL 10 MG PO TABS
10.0000 mg | ORAL_TABLET | Freq: Every day | ORAL | Status: DC
  Administered 2011-07-05 – 2011-07-07 (×3): 10 mg via ORAL
  Filled 2011-07-05 (×4): qty 1

## 2011-07-05 MED ORDER — SENNA 8.6 MG PO TABS
1.0000 | ORAL_TABLET | Freq: Two times a day (BID) | ORAL | Status: DC
  Administered 2011-07-05 – 2011-07-08 (×6): 8.6 mg via ORAL
  Filled 2011-07-05 (×7): qty 1

## 2011-07-05 MED ORDER — ONDANSETRON HCL 4 MG/2ML IJ SOLN: 4.0000 mg | Freq: Four times a day (QID) | INTRAMUSCULAR | Status: DC | PRN

## 2011-07-05 MED ORDER — DOCUSATE SODIUM 100 MG PO CAPS
100.0000 mg | ORAL_CAPSULE | Freq: Two times a day (BID) | ORAL | Status: DC
  Administered 2011-07-05 – 2011-07-08 (×6): 100 mg via ORAL
  Filled 2011-07-05 (×6): qty 1

## 2011-07-05 MED ORDER — ONDANSETRON HCL 4 MG PO TABS: 4.0000 mg | ORAL_TABLET | Freq: Four times a day (QID) | ORAL | Status: DC | PRN

## 2011-07-05 MED ORDER — METOCLOPRAMIDE HCL 5 MG/ML IJ SOLN: 5.0000 mg | Freq: Three times a day (TID) | INTRAMUSCULAR | Status: DC | PRN

## 2011-07-05 MED ORDER — SODIUM CHLORIDE 0.9 % IV SOLN: INTRAVENOUS | Status: DC

## 2011-07-05 MED ORDER — SIMVASTATIN 20 MG PO TABS
20.0000 mg | ORAL_TABLET | Freq: Every day | ORAL | Status: DC
  Administered 2011-07-05 – 2011-07-07 (×3): 20 mg via ORAL
  Filled 2011-07-05 (×4): qty 1

## 2011-07-05 MED ORDER — MUPIROCIN 2 % EX OINT
TOPICAL_OINTMENT | CUTANEOUS | Status: AC
  Filled 2011-07-05: qty 22

## 2011-07-05 MED ORDER — HYDROMORPHONE HCL PF 1 MG/ML IJ SOLN
0.2500 mg | INTRAMUSCULAR | Status: DC | PRN
  Administered 2011-07-05 (×2): 0.5 mg via INTRAVENOUS

## 2011-07-05 MED ORDER — CEFAZOLIN SODIUM-DEXTROSE 2-3 GM-% IV SOLR
2.0000 g | INTRAVENOUS | Status: AC
  Administered 2011-07-05: 2 g via INTRAVENOUS

## 2011-07-05 MED ORDER — PROPOFOL 10 MG/ML IV EMUL
INTRAVENOUS | Status: DC | PRN
  Administered 2011-07-05: 180 mg via INTRAVENOUS
  Administered 2011-07-05: 20 mg via INTRAVENOUS

## 2011-07-05 MED ORDER — RAMIPRIL 2.5 MG PO CAPS
2.5000 mg | ORAL_CAPSULE | Freq: Every day | ORAL | Status: DC
  Administered 2011-07-05 – 2011-07-08 (×4): 2.5 mg via ORAL
  Filled 2011-07-05 (×4): qty 1

## 2011-07-05 MED ORDER — MIRTAZAPINE 7.5 MG PO TABS
7.5000 mg | ORAL_TABLET | Freq: Every day | ORAL | Status: DC
  Administered 2011-07-05 – 2011-07-07 (×3): 7.5 mg via ORAL
  Filled 2011-07-05 (×4): qty 1

## 2011-07-05 MED ORDER — VITAMIN B-12 1000 MCG PO TABS
1000.0000 ug | ORAL_TABLET | Freq: Every day | ORAL | Status: DC
  Administered 2011-07-05 – 2011-07-08 (×4): 1000 ug via ORAL
  Filled 2011-07-05 (×4): qty 1

## 2011-07-05 MED ORDER — ASPIRIN 81 MG PO CHEW
81.0000 mg | CHEWABLE_TABLET | Freq: Every day | ORAL | Status: DC
  Administered 2011-07-05 – 2011-07-08 (×4): 81 mg via ORAL
  Filled 2011-07-05 (×4): qty 1

## 2011-07-05 MED ORDER — 0.9 % SODIUM CHLORIDE (POUR BTL) OPTIME
TOPICAL | Status: DC | PRN
  Administered 2011-07-05: 1000 mL

## 2011-07-05 MED ORDER — HYDROCODONE-ACETAMINOPHEN 5-325 MG PO TABS
1.0000 | ORAL_TABLET | ORAL | Status: DC | PRN
  Administered 2011-07-05: 2 via ORAL
  Administered 2011-07-06: 1 via ORAL
  Administered 2011-07-06: 2 via ORAL
  Filled 2011-07-05 (×4): qty 2

## 2011-07-05 MED ORDER — MUPIROCIN 2 % EX OINT
TOPICAL_OINTMENT | Freq: Once | CUTANEOUS | Status: DC
  Filled 2011-07-05: qty 22

## 2011-07-05 MED ORDER — FENTANYL CITRATE 0.05 MG/ML IJ SOLN
INTRAMUSCULAR | Status: DC | PRN
  Administered 2011-07-05: 50 ug via INTRAVENOUS
  Administered 2011-07-05: 100 ug via INTRAVENOUS
  Administered 2011-07-05: 50 ug via INTRAVENOUS

## 2011-07-05 MED ORDER — EPHEDRINE SULFATE 50 MG/ML IJ SOLN
INTRAMUSCULAR | Status: DC | PRN
  Administered 2011-07-05: 10 mg via INTRAVENOUS

## 2011-07-05 MED ORDER — SODIUM CHLORIDE 0.9 % IV SOLN
INTRAVENOUS | Status: DC
  Administered 2011-07-06 (×2): via INTRAVENOUS

## 2011-07-05 MED ORDER — METOCLOPRAMIDE HCL 10 MG PO TABS: 5.0000 mg | ORAL_TABLET | Freq: Three times a day (TID) | ORAL | Status: DC | PRN

## 2011-07-05 MED ORDER — MORPHINE SULFATE 4 MG/ML IJ SOLN: 0.0500 mg/kg | INTRAMUSCULAR | Status: DC | PRN

## 2011-07-05 MED ORDER — LACTATED RINGERS IV SOLN: INTRAVENOUS | Status: DC

## 2011-07-05 MED ORDER — METOPROLOL TARTRATE 25 MG PO TABS
25.0000 mg | ORAL_TABLET | Freq: Two times a day (BID) | ORAL | Status: DC
  Administered 2011-07-05 – 2011-07-08 (×6): 25 mg via ORAL
  Filled 2011-07-05 (×7): qty 1

## 2011-07-05 MED ORDER — LACTATED RINGERS IV SOLN
INTRAVENOUS | Status: DC | PRN
  Administered 2011-07-05 (×2): via INTRAVENOUS

## 2011-07-05 MED ORDER — LORAZEPAM 0.5 MG PO TABS: 0.5000 mg | ORAL_TABLET | Freq: Three times a day (TID) | ORAL | Status: DC | PRN

## 2011-07-05 SURGICAL SUPPLY — 55 items
BANDAGE ELASTIC 4 VELCRO ST LF (GAUZE/BANDAGES/DRESSINGS) ×4 IMPLANT
BANDAGE GAUZE ELAST BULKY 4 IN (GAUZE/BANDAGES/DRESSINGS) ×4 IMPLANT
BIT DRILL 3.2X240 A/O LONG (BIT) ×2 IMPLANT
BIT DRILL 3.5 (BIT) ×1
BIT DRILL 3.5MM (BIT) ×1 IMPLANT
BLADE SURG 10 STRL SS (BLADE) ×2 IMPLANT
BNDG COHESIVE 4X5 TAN STRL (GAUZE/BANDAGES/DRESSINGS) ×2 IMPLANT
BNDG COHESIVE 6X5 TAN STRL LF (GAUZE/BANDAGES/DRESSINGS) ×2 IMPLANT
CHLORAPREP W/TINT 26ML (MISCELLANEOUS) ×2 IMPLANT
CLAMP 2 3/5OST (Clamp) ×4 IMPLANT
CLAMP 5 HOLE (Clamp) ×2 IMPLANT
CLAMP PIN-ROD (Clamp) ×4 IMPLANT
CLAMP ROD-ROD (Clamp) ×4 IMPLANT
CLOTH BEACON ORANGE TIMEOUT ST (SAFETY) ×2 IMPLANT
COVER SURGICAL LIGHT HANDLE (MISCELLANEOUS) ×2 IMPLANT
CUFF TOURNIQUET SINGLE 34IN LL (TOURNIQUET CUFF) ×2 IMPLANT
CUFF TOURNIQUET SINGLE 44IN (TOURNIQUET CUFF) IMPLANT
DRAPE C-ARM 42X72 X-RAY (DRAPES) ×2 IMPLANT
DRAPE U-SHAPE 47X51 STRL (DRAPES) ×2 IMPLANT
DRILL BIT 3.5MM (BIT) ×2
DRSG ADAPTIC 3X8 NADH LF (GAUZE/BANDAGES/DRESSINGS) ×4 IMPLANT
DRSG PAD ABDOMINAL 8X10 ST (GAUZE/BANDAGES/DRESSINGS) ×2 IMPLANT
ELECT REM PT RETURN 9FT ADLT (ELECTROSURGICAL) ×2
ELECTRODE REM PT RTRN 9FT ADLT (ELECTROSURGICAL) ×1 IMPLANT
GAUZE XEROFORM 5X9 LF (GAUZE/BANDAGES/DRESSINGS) ×2 IMPLANT
GLOVE BIO SURGEON STRL SZ8 (GLOVE) ×2 IMPLANT
GLOVE BIOGEL PI IND STRL 8 (GLOVE) ×1 IMPLANT
GLOVE BIOGEL PI INDICATOR 8 (GLOVE) ×1
GOWN PREVENTION PLUS XLARGE (GOWN DISPOSABLE) ×2 IMPLANT
GOWN STRL NON-REIN LRG LVL3 (GOWN DISPOSABLE) ×4 IMPLANT
KIT BASIN OR (CUSTOM PROCEDURE TRAY) ×2 IMPLANT
KIT ROOM TURNOVER OR (KITS) ×2 IMPLANT
MANIFOLD NEPTUNE II (INSTRUMENTS) ×2 IMPLANT
NAIL FLEX WIN 3.0MM (Nail) ×2 IMPLANT
NEEDLE 22X1 1/2 (OR ONLY) (NEEDLE) IMPLANT
NS IRRIG 1000ML POUR BTL (IV SOLUTION) ×2 IMPLANT
PACK ORTHO EXTREMITY (CUSTOM PROCEDURE TRAY) ×2 IMPLANT
PAD ARMBOARD 7.5X6 YLW CONV (MISCELLANEOUS) ×4 IMPLANT
PAD CAST 4YDX4 CTTN HI CHSV (CAST SUPPLIES) ×1 IMPLANT
PADDING CAST COTTON 4X4 STRL (CAST SUPPLIES) ×2
ROD CARBON FIBER 300X9.5MM (Rod) ×4 IMPLANT
SCREW TRANSFIXING 6X350MM (Screw) ×2 IMPLANT
SOAP 2 % CHG 4 OZ (WOUND CARE) ×2 IMPLANT
SPONGE GAUZE 4X4 12PLY (GAUZE/BANDAGES/DRESSINGS) ×2 IMPLANT
STAPLER VISISTAT 35W (STAPLE) IMPLANT
SUCTION FRAZIER TIP 10 FR DISP (SUCTIONS) IMPLANT
SUT PROLENE 3 0 PS 2 (SUTURE) ×2 IMPLANT
SUT VIC AB 3-0 PS2 18 (SUTURE) ×1
SUT VIC AB 3-0 PS2 18XBRD (SUTURE) ×1 IMPLANT
SYR CONTROL 10ML LL (SYRINGE) IMPLANT
Shanz pins 150mm (Pin) ×4 IMPLANT
TOWEL OR 17X24 6PK STRL BLUE (TOWEL DISPOSABLE) ×2 IMPLANT
TOWEL OR 17X26 10 PK STRL BLUE (TOWEL DISPOSABLE) ×2 IMPLANT
TUBE CONNECTING 12X1/4 (SUCTIONS) IMPLANT
WATER STERILE IRR 1000ML POUR (IV SOLUTION) ×2 IMPLANT

## 2011-07-05 NOTE — Preoperative (Signed)
Beta Blockers  Pt took Beta Blocker @
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
## 2011-07-05 NOTE — Brief Op Note (Signed)
07/05/2011  5:15 PM  PATIENT:  Jacob Costa  78 y.o. male  PRE-OPERATIVE DIAGNOSIS: Left ankle bimalleoloar fracture with severe fracture blisters  POST-OPERATIVE DIAGNOSIS: same  Procedure(s): 1.  Open reduction and internal fixation of left ankle bimalleoloar fracture 2.  Application of biplanar external fixator 3.  Fluoro  SURGEON:  Toni Arthurs, MD  ASSISTANT: n/a  ANESTHESIA:   General  EBL:  minimal   TOURNIQUET:  * No tourniquets in log *  COMPLICATIONS:  None apparent  DISPOSITION:  Extubated, awake and stable to recovery.  DICTATION ID:   [id number]

## 2011-07-05 NOTE — Anesthesia Preprocedure Evaluation (Addendum)
Anesthesia Evaluation  Patient identified by MRN, date of birth, ID band Patient awake    Reviewed: Allergy & Precautions, H&P , NPO status , Patient's Chart, lab work & pertinent test results  History of Anesthesia Complications Negative for: history of anesthetic complications  Airway Mallampati: I      Dental  (+) Edentulous Upper and Edentulous Lower   Pulmonary neg pulmonary ROS,          Cardiovascular hypertension, Pt. on medications and Pt. on home beta blockers + CAD and + Past MI Rhythm:Regular Rate:Normal - Systolic murmurs    Neuro/Psych negative neurological ROS     GI/Hepatic negative GI ROS, Neg liver ROS,   Endo/Other  negative endocrine ROS  Renal/GU negative Renal ROS  negative genitourinary   Musculoskeletal   Abdominal   Peds  Hematology negative hematology ROS (+)   Anesthesia Other Findings   Reproductive/Obstetrics                          Anesthesia Physical Anesthesia Plan  ASA: III  Anesthesia Plan: General   Post-op Pain Management:    Induction: Intravenous  Airway Management Planned: LMA  Additional Equipment:   Intra-op Plan:   Post-operative Plan: Extubation in OR  Informed Consent: I have reviewed the patients History and Physical, chart, labs and discussed the procedure including the risks, benefits and alternatives for the proposed anesthesia with the patient or authorized representative who has indicated his/her understanding and acceptance.     Plan Discussed with: CRNA and Anesthesiologist  Anesthesia Plan Comments:         Anesthesia Quick Evaluation

## 2011-07-05 NOTE — Transfer of Care (Signed)
Immediate Anesthesia Transfer of Care Note  Patient: Jacob Costa  Procedure(s) Performed: Procedure(s) (LRB): EXTERNAL FIXATION LEG (Left)  Patient Location: PACU  Anesthesia Type: General  Level of Consciousness: awake, alert  and oriented  Airway & Oxygen Therapy: Patient Spontanous Breathing and Patient connected to face mask oxygen  Post-op Assessment: Report given to PACU RN, Post -op Vital signs reviewed and stable and Patient moving all extremities  Post vital signs: Reviewed and stable  Complications: No apparent anesthesia complications

## 2011-07-05 NOTE — Progress Notes (Signed)
Took EKG to [facility] to review, reviewed EKGs, cardiac notes, cath and ECHO-ok to proceed per [facility].

## 2011-07-05 NOTE — Anesthesia Postprocedure Evaluation (Signed)
  Anesthesia Post-op Note  Patient: Jacob Costa  Procedure(s) Performed: Procedure(s) (LRB): EXTERNAL FIXATION LEG (Left)  Patient Location: PACU  Anesthesia Type: General  Level of Consciousness: awake  Airway and Oxygen Therapy: Patient Spontanous Breathing  Post-op Pain: mild  Post-op Assessment: Post-op Vital signs reviewed  Post-op Vital Signs: Reviewed  Complications: No apparent anesthesia complications

## 2011-07-06 DIAGNOSIS — I10 Essential (primary) hypertension: Secondary | ICD-10-CM

## 2011-07-06 DIAGNOSIS — I2510 Atherosclerotic heart disease of native coronary artery without angina pectoris: Secondary | ICD-10-CM

## 2011-07-06 DIAGNOSIS — E785 Hyperlipidemia, unspecified: Secondary | ICD-10-CM

## 2011-07-06 DIAGNOSIS — S82899A Other fracture of unspecified lower leg, initial encounter for closed fracture: Secondary | ICD-10-CM

## 2011-07-06 LAB — BASIC METABOLIC PANEL
CO2: 31 mEq/L (ref 19–32)
Calcium: 8.4 mg/dL (ref 8.4–10.5)
Creatinine, Ser: 0.97 mg/dL (ref 0.50–1.35)

## 2011-07-06 LAB — CBC
MCH: 28.4 pg (ref 26.0–34.0)
MCV: 87.5 fL (ref 78.0–100.0)
Platelets: 155 10*3/uL (ref 150–400)
RBC: 4.01 MIL/uL — ABNORMAL LOW (ref 4.22–5.81)
RDW: 12.9 % (ref 11.5–15.5)

## 2011-07-06 NOTE — Progress Notes (Signed)
Subjective: 1 Day Post-Op Procedure(s) (LRB): EXTERNAL FIXATION LEG (Left) Patient reports pain as mild.  Denies cp, sob, n/v/f/c.  Objective: Vital signs in last 24 hours: Temp:  [97.7 F (36.5 C)-99.2 F (37.3 C)] 97.7 F (36.5 C) (05/08 0520) Pulse Rate:  [63-101] 63  (05/08 1035) Resp:  [16-18] 18  (05/08 0520) BP: (117-172)/(41-116) 135/64 mmHg (05/08 1035) SpO2:  [91 %-96 %] 93 % (05/08 0520) FiO2 (%):  [28 %] 28 % (05/07 2032)  Intake/Output from previous day: 05/07 0701 - 05/08 0700 In: 2500 [P.O.:600; I.V.:1900] Out: 900 [Urine:900] Intake/Output this shift: Total I/O In: 360 [P.O.:360] Out: 250 [Urine:250]   Basename 07/06/11 0640 07/05/11 1317  HGB 11.4* 13.0    Basename 07/06/11 0640 07/05/11 1317  WBC 8.2 8.9  RBC 4.01* 4.55  HCT 35.1* 39.6  PLT 155 177    Basename 07/06/11 0640 07/05/11 1317  NA 137 138  K 4.0 4.6  CL 99 100  CO2 31 28  BUN 10 11  CREATININE 0.97 1.00  GLUCOSE 92 94  CALCIUM 8.4 9.0   No results found for this basename: LABPT:2,INR:2 in the last 72 hours  L LE ex fix in place.  Ankle in slight PF.  NVI.  Assessment/Plan: 1 Day Post-Op Procedure(s) (LRB): EXTERNAL FIXATION LEG (Left) Up with therapy Will ask OT to fabricate foot plate to DF his ankle to neutral.  SNF placement pending.  Jacob Costa 07/06/2011, 1:43 PM

## 2011-07-06 NOTE — Progress Notes (Signed)
Physical Therapy Evaluation Patient Details Name: Jacob Costa MRN: [id number] DOB: 1932/04/08 Today's Date: 07/06/2011 Time: 2841-3244 PT Time Calculation (min): 25 min  PT Assessment / Plan / Recommendation Clinical Impression  Pt is a 78 y/o male admitted s/p left ankle fracture with external fixator as well as the below PT problem list.  Pt would benefit from acute PT to maximize independence and facilitate d/c to SNF.    PT Assessment  Patient needs continued PT services    Follow Up Recommendations  Skilled nursing facility    Equipment Recommendations  Defer to next venue    Frequency Min 3X/week    Precautions / Restrictions Precautions Precautions: Fall Restrictions Weight Bearing Restrictions: Yes LLE Weight Bearing: Non weight bearing Other Position/Activity Restrictions: Left LE external fixator.    Pertinent Vitals/Pain 1/10 in left ankle.  Pt repositioned.      Mobility  Bed Mobility Bed Mobility: Supine to Sit Supine to Sit: 4: Min assist;HOB flat;With rails Details for Bed Mobility Assistance: Assist for trunk with cues for hand and left LE placement to protect during mobility. Transfers Transfers: Sit to Stand;Stand to Sit Sit to Stand: 1: +2 Total assist;With upper extremity assist;From bed Sit to Stand: Patient Percentage: 40% Stand to Sit: 3: Mod assist;With upper extremity assist;To chair/3-in-1 Details for Transfer Assistance: Assist to translate trunk anterior over BOS with cues/assist to maintain NWBing left LE.  Cues for safest hand placement. Ambulation/Gait Ambulation/Gait Assistance: 1: +2 Total assist Ambulation/Gait: Patient Percentage: 60 Ambulation Distance (Feet): 15 Feet Assistive device: Rolling walker Ambulation/Gait Assistance Details: Assist for balance and to off weight left side in order to maintain NWBing left LE with max cues for safety throughout session.  Pt tending to keep RW too far from self as well as knocking it into  obstacles (such as tables and IV pole).   Gait Pattern: Step-to pattern;Decreased step length - right;Trunk flexed Stairs: No Wheelchair Mobility Wheelchair Mobility: No    Exercises     PT Goals Acute Rehab PT Goals PT Goal Formulation: With patient Time For Goal Achievement: 07/20/11 Potential to Achieve Goals: Good Pt will go Supine/Side to Sit: with supervision PT Goal: Supine/Side to Sit - Progress: Goal set today Pt will go Sit to Supine/Side: with supervision PT Goal: Sit to Supine/Side - Progress: Goal set today Pt will go Sit to Stand: with supervision PT Goal: Sit to Stand - Progress: Goal set today Pt will go Stand to Sit: with supervision PT Goal: Stand to Sit - Progress: Goal set today Pt will Ambulate: 51 - 150 feet;with min assist;with least restrictive assistive device PT Goal: Ambulate - Progress: Goal set today  Visit Information  Last PT Received On: 07/06/11 Assistance Needed: +2    Subjective Data  Subjective: "I am ready to do this." Patient Stated Goal: Go home.   Prior Functioning  Home Living Lives With: Spouse Available Help at Discharge: Family Type of Home: House Home Access: Stairs to enter Secretary/administrator of Steps: 1 Entrance Stairs-Rails: None Home Layout: One level Bathroom Shower/Tub: Forensic scientist: Standard Home Adaptive Equipment: Environmental consultant - rolling Prior Function Level of Independence: Independent Able to Take Stairs?: Yes Driving: Yes Vocation: Retired Musician: No difficulties    Cognition  Overall Cognitive Status: Appears within functional limits for tasks assessed/performed Arousal/Alertness: Awake/alert Orientation Level: Oriented X4 / Intact Behavior During Session: [facility] for tasks performed    Extremity/Trunk Assessment Right Upper Extremity Assessment RUE ROM/Strength/Tone: Within functional levels RUE  Sensation: WFL - Light Touch RUE Coordination: WFL - gross/fine  motor Left Upper Extremity Assessment LUE ROM/Strength/Tone: Within functional levels LUE Sensation: WFL - Light Touch LUE Coordination: WFL - gross/fine motor Right Lower Extremity Assessment RLE ROM/Strength/Tone: Within functional levels RLE Sensation: WFL - Light Touch RLE Coordination: WFL - gross/fine motor Left Lower Extremity Assessment LLE ROM/Strength/Tone: Within functional levels (Ankle not tested due to external fixator.) LLE Sensation: WFL - Light Touch LLE Coordination: WFL - gross/fine motor Trunk Assessment Trunk Assessment: Normal   Balance Balance Balance Assessed: No  End of Session PT - End of Session Equipment Utilized During Treatment: Gait belt Activity Tolerance: Patient tolerated treatment well Patient left: in chair;with call bell/phone within reach Nurse Communication: Mobility status;Weight bearing status   Cephus Shelling 07/06/2011, 12:05 PM  07/06/2011 Cephus Shelling, PT, DPT [phone]

## 2011-07-06 NOTE — Progress Notes (Signed)
Chart reviewed.  OT orders noted, and pt. For SNF - will defer OT ADL eval to SNF, as pt. Moving slowly and requiring +2 assist - not ready for ADLs.  Also noted order for foot plate.  Will plan to fabricate 07/07/11  Jeani Hawking, OTR/L [phone]

## 2011-07-06 NOTE — Progress Notes (Addendum)
Clinical Social Work Department CLINICAL SOCIAL WORK PLACEMENT NOTE 07/06/2011  Patient:  Jacob Costa, Jacob Costa  Account Number:  [id number] Admit date:  07/05/2011  Clinical Social Worker:  Unk Lightning, LCSW  Date/time:  07/06/2011 12:00 N  Clinical Social Work is seeking post-discharge placement for this patient at the following level of care:   SKILLED NURSING   (*CSW will update this form in Epic as items are completed)   07/06/2011  Patient/family provided with [facility] Department of Clinical Social Work's list of facilities offering this level of care within the geographic area requested by the patient (or if unable, by the patient's family).  07/06/2011  Patient/family informed of their freedom to choose among providers that offer the needed level of care, that participate in Medicare, Medicaid or managed care program needed by the patient, have an available bed and are willing to accept the patient.  07/06/2011  Patient/family informed of [facility]' ownership interest in [facility], as well as of the fact that they are under no obligation to receive care at this facility.  PASARR submitted to EDS on 07/06/2011 PASARR number received from EDS on 07/06/2011  FL2 transmitted to all facilities in geographic area requested by pt/family on  07/06/2011 FL2 transmitted to all facilities within larger geographic area on   Patient informed that his/her managed care company has contracts with or will negotiate with  certain facilities, including the following:  [facility] and [facility]   Patient/family informed of bed offers received:  07/07/11 Patient chooses bed at [facility] Physician recommends and patient chooses bed at    Patient to be transferred to  on  07/08/11 Patient to be transferred to facility by ambulance- [facility]  The following physician request were entered in Epic:   Additional Comments: DC to SNF today. Notified patient,  family, Nursing and SNF.  Lorri Frederick. [place], [place]  [phone]

## 2011-07-06 NOTE — Progress Notes (Signed)
Utilization review completed. Aadvika Konen, RN, BSN. 
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
## 2011-07-06 NOTE — Progress Notes (Addendum)
Clinical Social Work Department BRIEF PSYCHOSOCIAL ASSESSMENT 07/06/2011  Patient:  Jacob Costa, Jacob Costa     Account Number:  [id number]     Admit date:  07/05/2011  Clinical Social Worker:  Dennison Bulla  Date/Time:  07/06/2011 12:00 N  Referred by:  Physician  Date Referred:  07/06/2011 Referred for  SNF Placement   Other Referral:   Interview type:  Patient Other interview type:   Wife-Jacob Costa    PSYCHOSOCIAL DATA Living Status:  FAMILY Admitted from facility:   Level of care:   Primary support name:  Jacob Costa Primary support relationship to patient:  SPOUSE Degree of support available:   Strong    CURRENT CONCERNS Current Concerns  Post-Acute Placement   Other Concerns:    SOCIAL WORK ASSESSMENT / PLAN CSW received referral to assist with SNF placement after dc. CSW met with patient and wife at bedside to discuss plans. Per patient, he has never been to a SNF but is agreeable to admit to facility at dc. Patient reports he broke his ankle in two places when he was mowing the yard.    CSW provided patient and wife with SNF list. Wife and patient reported they were interested in [facility] or [facility]. CSW asked patient and wife to look over SNF list and have alternative options in case these facilities did not have available beds. CSW explained Medicare benefits for SNF and encouraged wife to call secondary insurance to determine if patient had SNF benefits. CSW explained CSW role and SNF process.    CSW inquired about any mental health history or dementia. Per wife, patient was diagnosed with dementia and takes Remeron. Wife reports patient has prescription for Ativan but has not had to use it. Wife reports that last summer she was worried about patient wandering but since his medication has changed, patient has not had any behaviors.    CSW completed FL2 and faxed out. CSW submitted for pasarr.    CSW will continue to follow.   Assessment/plan status:   Psychosocial Support/Ongoing Assessment of Needs Other assessment/ plan:   Information/referral to community resources:   SNF list    PATIENT'S/FAMILY'S RESPONSE TO PLAN OF CARE: Patient was alert and receptive to CSW assessment. Patient and wife were engaged during assessment and both agreeable to SNF. Wife agreed to look at other SNFs as back up options.

## 2011-07-06 NOTE — Op Note (Signed)
NAME:  Jacob Costa, Jacob Costa NO.:  [id number]  MEDICAL RECORD NO.:  [id number]  LOCATION:  5014                         FACILITY:  [facility]  PHYSICIAN:  Toni Arthurs, MD        DATE OF BIRTH:  Oct 22, 1932  DATE OF PROCEDURE: DATE OF DISCHARGE:                              OPERATIVE REPORT   PREOPERATIVE DIAGNOSIS:  Left ankle bimalleolar fracture with severe fracture blisters.  POSTOPERATIVE DIAGNOSIS:  Left ankle bimalleolar fracture with severe fracture blisters.  PROCEDURES: 1. Open reduction and internal fixation of left ankle bimalleolar     fracture. 2. Application of biplanar external fixator. 3. Intraoperative interpretation of fluoroscopic imaging.  SURGEON:  Toni Arthurs, MD  ANESTHESIA:  General.  ESTIMATED BLOOD LOSS:  Minimal.  TOURNIQUET TIME:  Zero.  COMPLICATIONS:  None apparent.  DISPOSITION:  Extubated, awake, and stable to recovery.  INDICATIONS FOR PROCEDURE:  The patient is a 78 year old male with past medical history significant for dementia and coronary artery disease. He fell while mowing his lawn approximately 3 days ago injuring his left ankle.  He was seen in the emergency department and diagnosed with a left ankle fracture.  He was immobilized with a splint and presented to my office yesterday.  He was noted at that time to have severe fracture blisters around the medial and lateral aspects of the ankle with subluxation of the talus laterally and displacement of the lateral component of the bimalleolar ankle fracture.  He has dementia and is currently unable to be nonweightbearing.  He is admitted now for provisional operative treatment of this displaced ankle fracture.  The severe fracture blisters preclude formal open reduction and internal fixation.  At a minimum, he requires closed reduction and external fixation, and possible limited open reduction with internal fixation. He and his family members understand the risks and  benefits of the alternative treatment options and elects surgical treatment.  They specifically understand risks of bleeding, infection, nerve damage, blood clots, need for additional surgery, amputation, and death.  PROCEDURE IN DETAIL:  After preoperative consent was obtained and the correct operative site was identified, the patient was brought to the operating room and placed supine on the operating table.  General anesthesia was induced.  Preoperative antibiotics were administered. Surgical time-out was taken.  The left lower extremity was prepped and draped in standard sterile fashion.  Two stab incisions were made on the anterior aspect of the leg and Schanz pins were inserted in bicortical fashion after pre-drilling the tibia.  The clamp was applied and tightened.  A centrally threaded Schanz pin was then inserted through a stab incision in the lateral aspect of the calcaneus.  Appropriate position was confirmed on the fluoroscopy.  The pin was driven across the calcaneus until the central threads engaged the tuberosity.  The pin on the calcaneus was then connected to the 2 pins in the tibia with 2 carbon fiber bars.  The ankle was provisionally reduced and these were tightened.  AP, mortise, and lateral views were obtained showing reduction of the talus beneath the distal tibia.  The lateral malleolus fracture was still noted to be displaced.  A stab incision was  made distal to the fibula.  A 3.5-mm drill bit was then inserted and used to make a hole in the distal tip of the fibula.  A 3-mm elastic titanium nail was selected and driven across the fibula and into the medullary canal of the fibula proximal to the fracture site.  This maneuver grossly reduced the fracture.  The pin was trimmed and advanced until it was buried beneath the level of the skin.  The wound was irrigated. Horizontal mattress sutures were used to close the stab incision.  Final AP, lateral, and mortise  views showed appropriate reduction of the fracture and appropriate position and length of all hardware.  The blisters were then dressed with Adaptic and padded.  The ankle was then wrapped with Kerlix and ABDs and 4-inch Ace wrap.  The patient was then awakened from Anesthesia and transported to the recovery room in stable condition.  FOLLOWUP PLAN:  The patient will likely require return to the operating room for formal open reduction and internal fixation of this fracture. He will be admitted to the hospital for physical therapy, occupational therapy, and likely placement in a skilled nursing facility.     Toni Arthurs, MD     JH/MEDQ  D:  07/05/2011  T:  07/06/2011  Job:  [id number]

## 2011-07-07 DIAGNOSIS — S82899A Other fracture of unspecified lower leg, initial encounter for closed fracture: Secondary | ICD-10-CM

## 2011-07-07 DIAGNOSIS — I10 Essential (primary) hypertension: Secondary | ICD-10-CM

## 2011-07-07 DIAGNOSIS — R410 Disorientation, unspecified: Secondary | ICD-10-CM | POA: Diagnosis present

## 2011-07-07 MED ORDER — HALOPERIDOL 2 MG PO TABS
2.0000 mg | ORAL_TABLET | Freq: Four times a day (QID) | ORAL | Status: DC | PRN
  Filled 2011-07-07: qty 1

## 2011-07-07 MED ORDER — DSS 100 MG PO CAPS: 100.0000 mg | ORAL_CAPSULE | Freq: Two times a day (BID) | ORAL | Status: AC

## 2011-07-07 MED ORDER — HYDROCODONE-ACETAMINOPHEN 5-325 MG PO TABS: 1.0000 | ORAL_TABLET | ORAL | Status: AC | PRN

## 2011-07-07 MED ORDER — HALOPERIDOL LACTATE 5 MG/ML IJ SOLN: 2.0000 mg | Freq: Four times a day (QID) | INTRAMUSCULAR | Status: DC | PRN

## 2011-07-07 MED ORDER — SENNA 8.6 MG PO TABS: 1.0000 | ORAL_TABLET | Freq: Two times a day (BID) | ORAL | Status: DC

## 2011-07-07 NOTE — Progress Notes (Signed)
Subjective: 2 Days Post-Op Procedure(s) (LRB): EXTERNAL FIXATION LEG (Left) Patient reports pain as mild.    Objective: Vital signs in last 24 hours: Temp:  [99 F (37.2 C)-100.1 F (37.8 C)] 99 F (37.2 C) (05/09 0504) Pulse Rate:  [75-82] 76  (05/09 1056) Resp:  [16-18] 18  (05/09 0504) BP: (132-138)/(60-71) 132/60 mmHg (05/09 1056) SpO2:  [93 %-95 %] 95 % (05/09 0504)  Intake/Output from previous day: 05/08 0701 - 05/09 0700 In: 1560 [P.O.:960; I.V.:600] Out: 1350 [Urine:1350] Intake/Output this shift:     Basename 07/06/11 0640 07/05/11 1317  HGB 11.4* 13.0    Basename 07/06/11 0640 07/05/11 1317  WBC 8.2 8.9  RBC 4.01* 4.55  HCT 35.1* 39.6  PLT 155 177    Basename 07/06/11 0640 07/05/11 1317  NA 137 138  K 4.0 4.6  CL 99 100  CO2 31 28  BUN 10 11  CREATININE 0.97 1.00  GLUCOSE 92 94  CALCIUM 8.4 9.0   No results found for this basename: LABPT:2,INR:2 in the last 72 hours  ex fix in place.  Dressings dry.  Assessment/Plan: 2 Days Post-Op Procedure(s) (LRB): EXTERNAL FIXATION LEG (Left) Discharge to SNF tomorrow.  Plan dressing change in AM prior to d/c.  Toni Arthurs 07/07/2011, 10:58 AM

## 2011-07-07 NOTE — Discharge Summary (Signed)
Physician Discharge Summary  Patient ID: Jacob Costa MRN: [id number] DOB/AGE: 1932/11/06 78 y.o.  Admit date: 07/05/2011 Discharge date: 07/08/11   Admission Diagnoses: left ankle bimal fracture with severe fracture blisters Htn, cad, dementia  Discharge Diagnoses: same, s/p orif and ex fix of left ankle. Active Problems:  HYPERTENSION  DEMENTIA  Bimalleolar fracture  Delirium   Discharged Condition: stable  Hospital Course: pt was taken to the OR on 5/7 for ORIF and application of ex fix to L ankle.  He tolerated the procedure wll and was discharged to SNF on 5/10.  He had PT and OT consults as well as hospitalist consult.  Consults: hospitalist  Significant Diagnostic Studies: xrays  Treatments: surgery: as above  Discharge Exam: Blood pressure 132/60, pulse 76, temperature 99 F (37.2 C), temperature source Oral, resp. rate 18, height 5' 10.5" (1.791 m), weight 106.595 kg (235 lb), SpO2 95.00%. wounds dressed and dry with ex fix in place.  Disposition: to SNF.  Pt will need daily dressing changes with dry kerlix around pins and 4x4s and kerlix around ankle with ace wrap over all the dressings.  He will need daily pin cleaning with normal saline.  He'll need PT to continue NWB on the LLE.  He'll likely need definitive surgical treatment in a few weeks.  Discharge Orders    Future Appointments: Provider: Department: Dept Phone: Center:   08/11/2011 10:45 AM Newt Lukes, MD [facility] [phone] [facility]     Future Orders Please Complete By Expires   Diet - low sodium heart healthy      Call MD / Call 911      Comments:   If you experience chest pain or shortness of breath, CALL 911 and be transported to the hospital emergency room.  If you develope a fever above 101 F, pus (white drainage) or increased drainage or redness at the wound, or calf pain, call your surgeon's office.   Constipation Prevention      Comments:   Drink plenty of fluids.  Prune juice may be  helpful.  You may use a stool softener, such as Colace (over the counter) 100 mg twice a day.  Use MiraLax (over the counter) for constipation as needed.   Increase activity slowly as tolerated      Weight Bearing as taught in Physical Therapy      Comments:   Use a walker or crutches as instructed.     Medication List  As of 07/07/2011 11:03 AM   TAKE these medications         aspirin 81 MG tablet   Take 81 mg by mouth daily.      donepezil 10 MG tablet   Commonly known as: ARICEPT   Take 10 mg by mouth at bedtime.      DSS 100 MG Caps   Take 100 mg by mouth 2 (two) times daily.      HYDROcodone-acetaminophen 5-325 MG per tablet   Commonly known as: NORCO   Take 1-2 tablets by mouth every 4 (four) hours as needed.      LORazepam 0.5 MG tablet   Commonly known as: ATIVAN   Take 0.5 mg by mouth every 8 (eight) hours as needed. For extreme agitation      metoprolol tartrate 25 MG tablet   Commonly known as: LOPRESSOR   Take 25 mg by mouth 2 (two) times daily.      mirtazapine 7.5 MG tablet   Commonly known as: REMERON  Take 7.5 mg by mouth at bedtime.      ramipril 2.5 MG capsule   Commonly known as: ALTACE   Take 1 capsule (2.5 mg total) by mouth daily.      senna 8.6 MG Tabs   Commonly known as: SENOKOT   Take 1 tablet (8.6 mg total) by mouth 2 (two) times daily.      simvastatin 40 MG tablet   Commonly known as: ZOCOR   Take 0.5 tablets (20 mg total) by mouth at bedtime.      vitamin B-12 1000 MCG tablet   Commonly known as: CYANOCOBALAMIN   Take 1,000 mcg by mouth daily.           Follow-up Information    Follow up with Toni Arthurs, MD in 10 days.   Contact information:   [address] [place] [address] [phone]          Signed: Toni Arthurs 07/07/2011, 11:03 AM

## 2011-07-07 NOTE — Progress Notes (Signed)
Pt restless while family visiting. Wife concerned about pt's increased confusion today. Is oriented to time/place/person. Denies pain. Attempted to get out of the bed once during the evening. Pt has rash along left side of back. Area cleaned and barrier cream applied. Also pulled out IV during the night.  Is taking fluids well, so IV not restarted.

## 2011-07-07 NOTE — Evaluation (Signed)
Occupational Therapy Evaluation Patient Details Name: Jacob Costa MRN: [id number] DOB: 1932/03/08 Today's Date: 07/07/2011 Time: 4098-1191 OT Time Calculation (min): 36 min  OT Assessment / Plan / Recommendation Clinical Impression       OT Assessment       Follow Up Recommendations       Barriers to Discharge      Equipment Recommendations       Recommendations for Other Services    Frequency       Precautions / Restrictions Precautions Precautions: Fall Restrictions Weight Bearing Restrictions: Yes LLE Weight Bearing: Non weight bearing Other Position/Activity Restrictions: Left LE external fixator.       ADL  ADL Comments: Foot plate fabricated for Lt. foot to maintain Lt. ankle and neutral dorsiflexion.  Pt. tolerated well.  RN made aware that foot plate to be worn at all times, but removed and checked q 2 hours to monitor for s/s pressure    OT Diagnosis:    OT Problem List:   OT Treatment Interventions:     OT Goals    Visit Information  Last OT Received On: 07/07/11    Subjective Data  Subjective: "I'm at [facility]" Patient Stated Goal: Pt. did not state   Prior Functioning  Home Living Available Help at Discharge: Skilled Nursing Facility Prior Function Level of Independence: Independent Able to Take Stairs?: Yes Driving: Yes Vocation: Retired Musician: No difficulties    Cognition  Overall Cognitive Status: Impaired Area of Impairment: Attention;Safety/judgement Orientation Level: Oriented X4 / Intact Behavior During Session: [facility] for tasks performed Safety/Judgement: Decreased awareness of safety precautions Cognition - Other Comments: Pt. attempted to get OOB on own.  Pt. noted with confusion    Extremity/Trunk Assessment     Mobility     Exercise    Balance    End of Session     Jeani Hawking M 07/07/2011, 4:16 PM

## 2011-07-07 NOTE — Progress Notes (Signed)
Clinical Social Work  CSW met with patient and wife at bedside to give offers. Patient and wife chose [facility]. Per MD, patient should dc on 07/08/11. CSW called SNF and updated facility on patient's dc plans. SNF agreeable to admission on 07/08/11. CSW will continue to follow to assist with dc needs.  [place], [place] [phone] (Coverage for Lovette Cliche)
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
## 2011-07-08 NOTE — Progress Notes (Signed)
Physical Therapy Treatment Patient Details Name: Jacob Costa MRN: [id number] DOB: [birth year] Today's Date: 07/08/2011 Time: 4098-1191 PT Time Calculation (min): 21 min  PT Assessment / Plan / Recommendation Comments on Treatment Session  Pt admitted s/p bimalleolar fx on L ankle and is progressing well. Pt ambulated this morning, but has severely decreased safety awareness and is impulsive with his movements. Pt is ready for a safe D/C to a SNF once medically cleared by MD.     Follow Up Recommendations  Skilled nursing facility    Barriers to Discharge        Equipment Recommendations  Defer to next venue    Recommendations for Other Services    Frequency Min 3X/week   Plan Discharge plan remains appropriate;Frequency remains appropriate    Precautions / Restrictions Precautions Precautions: Fall Restrictions Weight Bearing Restrictions: Yes LLE Weight Bearing: Non weight bearing Other Position/Activity Restrictions: Left LE external fixator.   Pertinent Vitals/Pain Pt reports 5/10 pain. RN aware and pt repositioned.     Mobility  Bed Mobility Bed Mobility: Supine to Sit Supine to Sit: 4: Min guard Details for Bed Mobility Assistance: Guard for balance. Cues for L LE placement and sequence.  Transfers Transfers: Sit to Stand;Stand to Sit Sit to Stand: 1: +2 Total assist;From bed;With upper extremity assist Sit to Stand: Patient Percentage: 60% Stand to Sit: 1: +2 Total assist;With armrests;To chair/3-in-1;With upper extremity assist Stand to Sit: Patient Percentage: 70% Details for Transfer Assistance: Assist for balance and translate trunk anterior over BOS with cues  Ambulation/Gait Ambulation/Gait Assistance: 1: +2 Total assist Ambulation/Gait: Patient Percentage: 70% Ambulation Distance (Feet): 15 Feet Assistive device: Rolling walker Ambulation/Gait Assistance Details: Assist for balance and to off weight L LE. Cues for pushing through arms. Pt tends to be  impulsive with actions, needs max verbal cues to maintain NWB status and to remain inside walker to remain safe.   Gait Pattern: Step-to pattern;Decreased step length - right;Trunk flexed Stairs: No Wheelchair Mobility Wheelchair Mobility: No    Exercises     PT Diagnosis:    PT Problem List:   PT Treatment Interventions:     PT Goals Acute Rehab PT Goals PT Goal Formulation: With patient Time For Goal Achievement: 07/20/11 Potential to Achieve Goals: Good PT Goal: Supine/Side to Sit - Progress: Progressing toward goal PT Goal: Sit to Stand - Progress: Progressing toward goal PT Goal: Stand to Sit - Progress: Progressing toward goal PT Goal: Ambulate - Progress: Progressing toward goal  Visit Information  Last PT Received On: 07/08/11 Assistance Needed: +2    Subjective Data  Subjective: "I'm ready to go." Patient Stated Goal: Go home.   Cognition  Overall Cognitive Status: Impaired Area of Impairment: Attention;Safety/judgement Arousal/Alertness: Awake/alert Orientation Level: Oriented X4 / Intact Behavior During Session: [facility] for tasks performed Safety/Judgement: Decreased awareness of safety precautions;Impulsive    Balance  Balance Balance Assessed: No  End of Session PT - End of Session Equipment Utilized During Treatment: Gait belt Activity Tolerance: Patient tolerated treatment well Patient left: in chair;with call bell/phone within reach Nurse Communication: Mobility status;Weight bearing status    Oretha Ellis 07/08/2011, 12:11 PM

## 2011-07-08 NOTE — Progress Notes (Signed)
Agree with treatment session.  07/08/2011 Cephus Shelling, PT, DPT [phone]

## 2011-07-08 NOTE — Progress Notes (Signed)
Subjective: 3 Days Post-Op Procedure(s) (LRB): EXTERNAL FIXATION LEG (Left) Patient reports pain as mild.    Objective: Vital signs in last 24 hours: Temp:  [98.3 F (36.8 C)-98.9 F (37.2 C)] 98.9 F (37.2 C) (05/10 0521) Pulse Rate:  [71-79] 78  (05/10 1005) Resp:  [16-18] 16  (05/10 0521) BP: (119-151)/(68-76) 146/72 mmHg (05/10 1005) SpO2:  [92 %-93 %] 93 % (05/10 0521)  Intake/Output from previous day: 05/09 0701 - 05/10 0700 In: 540 [P.O.:540] Out: 1600 [Urine:1600] Intake/Output this shift:     Basename 07/06/11 0640  HGB 11.4*    Basename 07/06/11 0640  WBC 8.2  RBC 4.01*  HCT 35.1*  PLT 155    Basename 07/06/11 0640  NA 137  K 4.0  CL 99  CO2 31  BUN 10  CREATININE 0.97  GLUCOSE 92  CALCIUM 8.4   No results found for this basename: LABPT:2,INR:2 in the last 72 hours  dressing removed.  blisters resolving.  no drainage.  swelling improved.  ex fix stable.  Assessment/Plan: 3 Days Post-Op Procedure(s) (LRB): EXTERNAL FIXATION LEG (Left) Discharge to SNF  No change since d/c summary yesterday.  Toni Arthurs 07/08/2011, 1:39 PM

## 2011-07-08 NOTE — Discharge Planning (Signed)
Report called top Tonja at [facility].  Doyle Askew, RN  07/08/11 1415

## 2011-07-11 ENCOUNTER — Encounter (HOSPITAL_COMMUNITY): Payer: Self-pay | Admitting: Orthopedic Surgery

## 2011-07-27 ENCOUNTER — Encounter (HOSPITAL_COMMUNITY): Payer: Self-pay | Admitting: *Deleted

## 2011-07-27 ENCOUNTER — Encounter (HOSPITAL_COMMUNITY): Payer: Self-pay | Admitting: Respiratory Therapy

## 2011-07-27 NOTE — Progress Notes (Signed)
Spoke to [facility] in [facility] office, requested orders.

## 2011-07-27 NOTE — Progress Notes (Signed)
Spoke to pt's nurse at [facility] - gave instructions for pt to be NPO after MN, take metoprolol and ativan prn in AM

## 2011-07-28 ENCOUNTER — Encounter (HOSPITAL_COMMUNITY): Payer: Self-pay | Admitting: Anesthesiology

## 2011-07-28 ENCOUNTER — Encounter (HOSPITAL_COMMUNITY)
Admission: RE | Disposition: A | Discharge status: Skilled Nursing Facility | Payer: Self-pay | Source: Ambulatory Visit | Attending: Orthopedic Surgery

## 2011-07-28 ENCOUNTER — Ambulatory Visit (HOSPITAL_COMMUNITY): Payer: Medicare Other

## 2011-07-28 ENCOUNTER — Ambulatory Visit (HOSPITAL_COMMUNITY): Payer: Medicare Other | Admitting: Anesthesiology

## 2011-07-28 ENCOUNTER — Ambulatory Visit (HOSPITAL_COMMUNITY)
Admission: RE | Admit: 2011-07-28 | Discharge: 2011-07-28 | Disposition: A | Discharge status: Skilled Nursing Facility | Payer: Medicare Other | Source: Ambulatory Visit | Attending: Orthopedic Surgery | Admitting: Orthopedic Surgery

## 2011-07-28 ENCOUNTER — Ambulatory Visit (HOSPITAL_BASED_OUTPATIENT_CLINIC_OR_DEPARTMENT_OTHER): Admission: RE | Admit: 2011-07-28 | Payer: Medicare Other | Source: Ambulatory Visit | Admitting: Orthopedic Surgery

## 2011-07-28 ENCOUNTER — Encounter (HOSPITAL_COMMUNITY): Payer: Self-pay | Admitting: *Deleted

## 2011-07-28 DIAGNOSIS — S8263XA Displaced fracture of lateral malleolus of unspecified fibula, initial encounter for closed fracture: Secondary | ICD-10-CM | POA: Insufficient documentation

## 2011-07-28 DIAGNOSIS — I10 Essential (primary) hypertension: Secondary | ICD-10-CM | POA: Insufficient documentation

## 2011-07-28 DIAGNOSIS — I2510 Atherosclerotic heart disease of native coronary artery without angina pectoris: Secondary | ICD-10-CM | POA: Insufficient documentation

## 2011-07-28 DIAGNOSIS — S82892A Other fracture of left lower leg, initial encounter for closed fracture: Secondary | ICD-10-CM

## 2011-07-28 DIAGNOSIS — W19XXXA Unspecified fall, initial encounter: Secondary | ICD-10-CM | POA: Insufficient documentation

## 2011-07-28 HISTORY — PX: ORIF ANKLE FRACTURE: SHX5408

## 2011-07-28 LAB — CBC
Hemoglobin: 12.6 g/dL — ABNORMAL LOW (ref 13.0–17.0)
MCH: 28.1 pg (ref 26.0–34.0)
Platelets: 191 10*3/uL (ref 150–400)
RBC: 4.49 MIL/uL (ref 4.22–5.81)
WBC: 6.3 10*3/uL (ref 4.0–10.5)

## 2011-07-28 LAB — BASIC METABOLIC PANEL
CO2: 28 mEq/L (ref 19–32)
Calcium: 8.9 mg/dL (ref 8.4–10.5)
GFR calc non Af Amer: 78 mL/min — ABNORMAL LOW (ref >90)
Glucose, Bld: 91 mg/dL (ref 70–99)
Potassium: 4.6 mEq/L (ref 3.5–5.1)
Sodium: 139 mEq/L (ref 135–145)

## 2011-07-28 SURGERY — OPEN REDUCTION INTERNAL FIXATION (ORIF) ANKLE FRACTURE
Anesthesia: General | Site: Ankle | Laterality: Left | Wound class: Contaminated

## 2011-07-28 SURGERY — REMOVAL, HARDWARE
Anesthesia: General | Laterality: Left

## 2011-07-28 MED ORDER — OXYCODONE HCL 5 MG PO TABS: 5.0000 mg | ORAL_TABLET | ORAL | Status: AC | PRN

## 2011-07-28 MED ORDER — 0.9 % SODIUM CHLORIDE (POUR BTL) OPTIME
TOPICAL | Status: DC | PRN
  Administered 2011-07-28: 1000 mL

## 2011-07-28 MED ORDER — SODIUM CHLORIDE 0.9 % IV SOLN: INTRAVENOUS | Status: DC

## 2011-07-28 MED ORDER — FENTANYL CITRATE 0.05 MG/ML IJ SOLN: 50.0000 ug | INTRAMUSCULAR | Status: DC | PRN

## 2011-07-28 MED ORDER — MUPIROCIN 2 % EX OINT
TOPICAL_OINTMENT | CUTANEOUS | Status: AC
  Filled 2011-07-28: qty 22

## 2011-07-28 MED ORDER — MIDAZOLAM HCL 2 MG/2ML IJ SOLN: 1.0000 mg | INTRAMUSCULAR | Status: DC | PRN

## 2011-07-28 MED ORDER — CEFAZOLIN SODIUM-DEXTROSE 2-3 GM-% IV SOLR: 2.0000 g | INTRAVENOUS | Status: DC

## 2011-07-28 MED ORDER — ONDANSETRON HCL 4 MG/2ML IJ SOLN
INTRAMUSCULAR | Status: DC | PRN
  Administered 2011-07-28: 4 mg via INTRAVENOUS

## 2011-07-28 MED ORDER — LACTATED RINGERS IV SOLN: INTRAVENOUS | Status: DC

## 2011-07-28 MED ORDER — BACITRACIN ZINC 500 UNIT/GM EX OINT
TOPICAL_OINTMENT | CUTANEOUS | Status: DC | PRN
  Administered 2011-07-28: 1 via TOPICAL

## 2011-07-28 MED ORDER — ONDANSETRON HCL 4 MG/2ML IJ SOLN: 4.0000 mg | Freq: Four times a day (QID) | INTRAMUSCULAR | Status: DC | PRN

## 2011-07-28 MED ORDER — LACTATED RINGERS IV SOLN
INTRAVENOUS | Status: DC | PRN
  Administered 2011-07-28 (×2): via INTRAVENOUS

## 2011-07-28 MED ORDER — PROPOFOL 10 MG/ML IV EMUL
INTRAVENOUS | Status: DC | PRN
  Administered 2011-07-28: 150 mg via INTRAVENOUS

## 2011-07-28 MED ORDER — LIDOCAINE HCL (CARDIAC) 20 MG/ML IV SOLN
INTRAVENOUS | Status: DC | PRN
  Administered 2011-07-28: 40 mg via INTRAVENOUS
  Administered 2011-07-28: 60 mg via INTRAVENOUS

## 2011-07-28 MED ORDER — FENTANYL CITRATE 0.05 MG/ML IJ SOLN
INTRAMUSCULAR | Status: DC | PRN
  Administered 2011-07-28 (×4): 50 ug via INTRAVENOUS

## 2011-07-28 MED ORDER — CEFAZOLIN SODIUM-DEXTROSE 2-3 GM-% IV SOLR
INTRAVENOUS | Status: AC
  Administered 2011-07-28: 2 g via INTRAVENOUS
  Filled 2011-07-28: qty 50

## 2011-07-28 MED ORDER — GLYCOPYRROLATE 0.2 MG/ML IJ SOLN
INTRAMUSCULAR | Status: DC | PRN
  Administered 2011-07-28: .6 mg via INTRAVENOUS

## 2011-07-28 MED ORDER — BUPIVACAINE-EPINEPHRINE PF 0.5-1:200000 % IJ SOLN
INTRAMUSCULAR | Status: DC | PRN
  Administered 2011-07-28: 30 mL

## 2011-07-28 MED ORDER — HYDROMORPHONE HCL PF 1 MG/ML IJ SOLN: 0.2500 mg | INTRAMUSCULAR | Status: DC | PRN

## 2011-07-28 MED ORDER — ROCURONIUM BROMIDE 100 MG/10ML IV SOLN
INTRAVENOUS | Status: DC | PRN
  Administered 2011-07-28: 50 mg via INTRAVENOUS

## 2011-07-28 MED ORDER — NEOSTIGMINE METHYLSULFATE 1 MG/ML IJ SOLN
INTRAMUSCULAR | Status: DC | PRN
  Administered 2011-07-28: 5 mg via INTRAVENOUS

## 2011-07-28 MED ORDER — CHLORHEXIDINE GLUCONATE 4 % EX LIQD: 60.0000 mL | Freq: Once | CUTANEOUS | Status: DC

## 2011-07-28 SURGICAL SUPPLY — 69 items
BANDAGE ESMARK 6X9 LF (GAUZE/BANDAGES/DRESSINGS) ×1 IMPLANT
BLADE SURG 15 STRL LF DISP TIS (BLADE) ×3 IMPLANT
BLADE SURG 15 STRL SS (BLADE) ×3
BNDG CMPR 9X6 STRL LF SNTH (GAUZE/BANDAGES/DRESSINGS) ×1
BNDG COHESIVE 4X5 TAN STRL (GAUZE/BANDAGES/DRESSINGS) ×2 IMPLANT
BNDG COHESIVE 6X5 TAN STRL LF (GAUZE/BANDAGES/DRESSINGS) ×2 IMPLANT
BNDG ESMARK 6X9 LF (GAUZE/BANDAGES/DRESSINGS) ×2
CHLORAPREP W/TINT 26ML (MISCELLANEOUS) ×2 IMPLANT
CLOTH BEACON ORANGE TIMEOUT ST (SAFETY) ×2 IMPLANT
COVER SURGICAL LIGHT HANDLE (MISCELLANEOUS) ×2 IMPLANT
CUFF TOURNIQUET SINGLE 34IN LL (TOURNIQUET CUFF) ×2 IMPLANT
CUFF TOURNIQUET SINGLE 44IN (TOURNIQUET CUFF) IMPLANT
DRAPE C-ARM 42X72 X-RAY (DRAPES) ×2 IMPLANT
DRAPE C-ARMOR (DRAPES) IMPLANT
DRAPE INCISE IOBAN 66X45 STRL (DRAPES) ×2 IMPLANT
DRAPE ORTHO SPLIT 77X108 STRL (DRAPES)
DRAPE SURG ORHT 6 SPLT 77X108 (DRAPES) IMPLANT
DRAPE U-SHAPE 47X51 STRL (DRAPES) ×2 IMPLANT
DRSG ADAPTIC 3X8 NADH LF (GAUZE/BANDAGES/DRESSINGS) ×2 IMPLANT
DRSG PAD ABDOMINAL 8X10 ST (GAUZE/BANDAGES/DRESSINGS) ×6 IMPLANT
ELECT REM PT RETURN 9FT ADLT (ELECTROSURGICAL) ×2
ELECTRODE REM PT RTRN 9FT ADLT (ELECTROSURGICAL) ×1 IMPLANT
GLOVE BIO SURGEON STRL SZ8 (GLOVE) ×4 IMPLANT
GLOVE BIO SURGEON STRL SZ8.5 (GLOVE) ×2 IMPLANT
GLOVE BIOGEL PI IND STRL 8 (GLOVE) ×1 IMPLANT
GLOVE BIOGEL PI INDICATOR 8 (GLOVE) ×1
GLOVE EXAM NITRILE LRG STRL (GLOVE) ×2 IMPLANT
GLOVE SURG SS PI 7.5 STRL IVOR (GLOVE) ×2 IMPLANT
GLOVE SURG SS PI 8.5 STRL IVOR (GLOVE) ×1
GLOVE SURG SS PI 8.5 STRL STRW (GLOVE) ×1 IMPLANT
GOWN PREVENTION PLUS XLARGE (GOWN DISPOSABLE) IMPLANT
GOWN STRL NON-REIN LRG LVL3 (GOWN DISPOSABLE) IMPLANT
KIT BASIN OR (CUSTOM PROCEDURE TRAY) ×2 IMPLANT
KIT ROOM TURNOVER OR (KITS) ×2 IMPLANT
MANIFOLD NEPTUNE II (INSTRUMENTS) ×2 IMPLANT
NEEDLE 22X1 1/2 (OR ONLY) (NEEDLE) IMPLANT
NS IRRIG 1000ML POUR BTL (IV SOLUTION) ×2 IMPLANT
PACK ORTHO EXTREMITY (CUSTOM PROCEDURE TRAY) ×2 IMPLANT
PAD ARMBOARD 7.5X6 YLW CONV (MISCELLANEOUS) ×4 IMPLANT
PAD CAST 4YDX4 CTTN HI CHSV (CAST SUPPLIES) ×1 IMPLANT
PADDING CAST ABS 4INX4YD NS (CAST SUPPLIES) ×2
PADDING CAST ABS COTTON 4X4 ST (CAST SUPPLIES) ×2 IMPLANT
PADDING CAST COTTON 4X4 STRL (CAST SUPPLIES) ×2
PADDING CAST COTTON 6X4 STRL (CAST SUPPLIES) ×2 IMPLANT
SPLINT PLASTER CAST XFAST 5X30 (CAST SUPPLIES) ×1 IMPLANT
SPLINT PLASTER XFAST SET 5X30 (CAST SUPPLIES) ×1
SPONGE GAUZE 4X4 12PLY (GAUZE/BANDAGES/DRESSINGS) ×2 IMPLANT
SPONGE LAP 18X18 X RAY DECT (DISPOSABLE) ×2 IMPLANT
STAPLER VISISTAT 35W (STAPLE) IMPLANT
SUCTION FRAZIER TIP 10 FR DISP (SUCTIONS) ×2 IMPLANT
SUT MNCRL AB 4-0 PS2 18 (SUTURE) ×2 IMPLANT
SUT PROLENE 3 0 PS 2 (SUTURE) ×2 IMPLANT
SUT VIC AB 0 CT1 27 (SUTURE) ×2
SUT VIC AB 0 CT1 27XBRD ANBCTR (SUTURE) ×1 IMPLANT
SUT VIC AB 2-0 CT1 27 (SUTURE) ×2
SUT VIC AB 2-0 CT1 TAPERPNT 27 (SUTURE) ×1 IMPLANT
SUT VIC AB 3-0 PS2 18 (SUTURE)
SUT VIC AB 3-0 PS2 18XBRD (SUTURE) IMPLANT
SYR CONTROL 10ML LL (SYRINGE) IMPLANT
TOWEL OR 17X24 6PK STRL BLUE (TOWEL DISPOSABLE) ×2 IMPLANT
TOWEL OR 17X26 10 PK STRL BLUE (TOWEL DISPOSABLE) ×2 IMPLANT
TRIMED DRILL BIT 1.6 MM ×2 IMPLANT
TUBE CONNECTING 12X1/4 (SUCTIONS) ×2 IMPLANT
Trimed Cancellous screw 3.8-12 ×4 IMPLANT
Trimed Cancelous screw 3.8-16 (Screw) ×2 IMPLANT
Trimed cortical screw 3.2 - 12 ×2 IMPLANT
Trimed sidewinder plate, 6 hole medium (Plate) ×2 IMPLANT
WATER STERILE IRR 1000ML POUR (IV SOLUTION) IMPLANT
trimed cortical screw 3.2-14 (Screw) ×4 IMPLANT

## 2011-07-28 NOTE — Op Note (Signed)
07/28/2011  12:33 PM  PATIENT:  Jacob Costa  78 y.o. male  PRE-OPERATIVE DIAGNOSIS:  LEFT LATERAL MALLEOLLUS FRACTURE STATUS POST EXFIX  POST-OPERATIVE DIAGNOSIS:  LEFT LATERAL MALLEOLLUS FRACTURE STATUS POST EXFIX  Procedure(s): 1.  Removal of left lower extremity external fixator under anesthesia 2.  Removal of deep implant from left ankle 3. OPEN REDUCTION INTERNAL FIXATION (ORIF) left ankle lateral malleolus fracture 4.  Fluoro  SURGEON:  Toni Arthurs, MD  ASSISTANT: n/a  ANESTHESIA:   General, regional  EBL:  minimal   TOURNIQUET:  See anesthesia record  COMPLICATIONS:  None apparent  DISPOSITION:  Extubated, awake and stable to recovery.  DICTATION ID:  [id number]

## 2011-07-28 NOTE — Discharge Instructions (Signed)
Jacob Saling, MD [facility]  Please read the following information regarding your care after surgery.  Medications  You only need a prescription for the narcotic pain medicine (ex. oxycodone, Percocet, Norco).  All of the other medicines listed below are available over the counter. ? acetominophen (Tylenol) 650 mg every 4-6 hours as you need for minor pain ? oxycodone as prescribed for moderate to severe pain ?   Narcotic pain medicine (ex. oxycodone, Percocet, Vicodin) will cause constipation.  To prevent this problem, take the following medicines while you are taking any pain medicine. ? docusate sodium (Colace) 100 mg twice a day ? senna (Senokot) 2 tablets twice a day  ? To help prevent blood clots, take an aspirin (325 mg) once a day for a month after surgery.  You should also get up every hour while you are awake to move around.    Weight Bearing ? Bear weight when you are able on your operated leg or foot. ? Bear weight only on the heel of your operated foot in the post-op shoe. X Do not bear any weight on the operated leg or foot.  Cast / Splint / Dressing X Keep your splint or cast clean and dry.  Don't put anything (coat hanger, pencil, etc) down inside of it.  If it gets damp, use a hair dryer on the cool setting to dry it.  If it gets soaked, call the office to schedule an appointment for a cast change. ? Remove your dressing 3 days after surgery and cover the incisions with dry dressings.    After your dressing, cast or splint is removed; you may shower, but do not soak or scrub the wound.  Allow the water to run over it, and then gently pat it dry.  Swelling It is normal for you to have swelling where you had surgery.  To reduce swelling and pain, keep your toes above your nose for at least 3 days after surgery.  It may be necessary to keep your foot or leg elevated for several weeks.  If it hurts, it should be elevated.  Follow Up Call my office at  [phone] when you are discharged from the hospital or surgery center to schedule an appointment to be seen two weeks after surgery.  Call my office at [phone] if you develop a fever >101.5 F, nausea, vomiting, bleeding from the surgical site or severe pain.     

## 2011-07-28 NOTE — Transfer of Care (Signed)
Immediate Anesthesia Transfer of Care Note  Patient: Jacob Costa  Procedure(s) Performed: Procedure(s) (LRB): OPEN REDUCTION INTERNAL FIXATION (ORIF) ANKLE FRACTURE (Left)  Patient Location: PACU  Anesthesia Type: General  Level of Consciousness: awake and patient cooperative  Airway & Oxygen Therapy: Patient Spontanous Breathing and Patient connected to face mask oxygen  Post-op Assessment: Report given to PACU RN  Post vital signs: Reviewed and stable  Complications: No apparent anesthesia complications

## 2011-07-28 NOTE — Op Note (Signed)
NAME:  Jacob Costa, Jacob Costa NO.:  [id number]  MEDICAL RECORD NO.:  [id number]  LOCATION:  [facility]                         FACILITY:  [facility]  PHYSICIAN:  Toni Arthurs, MD        DATE OF BIRTH:  [birth year]  DATE OF PROCEDURE:  07/28/2011 DATE OF DISCHARGE:  07/28/2011                              OPERATIVE REPORT   PREOPERATIVE DIAGNOSIS:  Left ankle lateral malleolus fracture, status post provisional reduction and application of external fixator.  POSTOPERATIVE DIAGNOSIS:  Left ankle lateral malleolus fracture, status post provisional reduction and application of external fixator.  PROCEDURE: 1. Removal of left lower extremity external fixation under anesthesia. 2. Removal of deep implant from the left ankle lateral malleolus. 3. Open reduction and internal fixation of left ankle lateral     malleolus fracture. 4. Intraoperative interpretation of fluoroscopic imaging.  SURGEON:  Toni Arthurs, MD.  ANESTHESIA:  General, regional.  ESTIMATED BLOOD LOSS:  Minimal.  TOURNIQUET TIME:  See anesthesia record.  COMPLICATIONS:  None apparent.  DISPOSITION:  Extubated, awake, and stable to recovery.  INDICATIONS FOR PROCEDURE:  The patient is a 78 year old male who fell approximately 3 weeks ago while mowing his lawn.  He sustained a displaced left ankle lateral malleolus fracture.  He had tremendous swelling and nearly circumferential fracture blisters.  The decision was made at that time to provisionally reduce his fracture and hold it in a reduced position with an intramedullary implant and apply an external fixator.  He underwent that surgery and was then discharged to a skilled nursing facility.  He was seen in clinic and his fracture blisters had resolved to the point where he can undergo surgery safely.  He presents now for operative treatment of this injury in a staged fashion.  He understands the risks and benefits, the alternative treatment options and elects  surgical treatment.  He specifically understands risks of bleeding, infection, nerve damage, blood clots, need for additional surgery, amputation, and death.  PROCEDURE IN DETAIL:  After preoperative consent was obtained, the correct operative site was identified.  The patient was brought to the operating room and placed supine on the operating table.  General anesthesia was induced.  Preoperative antibiotics were administered. Surgical time-out was taken.  The left lower extremity was prepped and draped in standard sterile fashion with the tourniquet around the thigh. Prior to prepping and draping, the external fixator had been removed in its entirety.  The left lower extremity was exsanguinated and tourniquet was inflated to 225 mmHg.  A longitudinal incision was made over the lateral malleolus.  A blunt dissection was carried down through the subcutaneous tissue.  The lateral aspect of the fibular fracture was identified.  It was noted to be gapped open posteriorly.  The fibrous tissue was all excised with a curette and rongeur.  The fracture was mobilized.  Attention was then turned to the distal end of the fibula where blunt dissection was carried around the tip of the fibula.  The previously placed intramedullary pin was identified.  It was cleaned of all surrounding soft tissue.  It was withdrawn out through the end of the fibula without difficulty.  Attention was  then returned to the fracture site, where the fracture site was further mobilized and cleaned of all intervening fibrous tissue.  It was irrigated copiously.  The fracture was reduced and held with a lobster claw, a six-hole TriMed Sidewinder plate was selected and applied to the lateral aspect of the fibula, centered over the fracture site.  It was fixed in the oval holes proximally and distally with a bicortical screw proximally and an unicortical screw distally.  The distraction device was then applied and the  fracture was reduced.  The screws were tightened.  AP and lateral fluoroscopic views showed appropriate reduction of the fracture, appropriate position and length of the hardware.  The compression tabs were then crimped anteriorly and posteriorly compressing the fracture line.  The plate was then fixed proximally with 2 more bicortical screws and distally with 2 more unicortical screws.  The wound was irrigated copiously.  AP, mortise, and lateral fluoroscopic images were obtained showing appropriate reduction of the fracture and appropriate position and length of all hardware.  Dorsiflexion and external rotation stress was applied under the mortise view and there was no evidence of widening of the syndesmosis or the medial clear space.  Subcutaneous tissue was then approximated over the plate using inverted simple sutures of 0 Vicryl.  Subcutaneous tissue was closed with inverted simple sutures of 3-0 Monocryl and a running 3-0 Prolene suture was used to close the skin.  Sterile dressings were applied, followed by a well-padded short- leg splint.  The external fixator holes had all been debrided with a curette and irrigated copiously prior to starting the fixation of the fibula.  FOLLOWUP PLAN:  The patient will be nonweightbearing on his left lower extremity.  He will return to his nursing home today.  He will follow up with me in 2 weeks for suture removal and conversion to a cast.     Toni Arthurs, MD     JH/MEDQ  D:  07/28/2011  T:  07/28/2011  Job:  [id number]
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
## 2011-07-28 NOTE — Preoperative (Signed)
Beta Blockers   Reason not to administer Beta Blockers:Lopressor taken this am 

## 2011-07-28 NOTE — Anesthesia Preprocedure Evaluation (Addendum)
Anesthesia Evaluation  Patient identified by MRN, date of birth, ID band Patient awake    Reviewed: Allergy & Precautions, H&P , NPO status , Patient's Chart, lab work & pertinent test results  Airway Mallampati: II  Neck ROM: full    Dental   Pulmonary          Cardiovascular hypertension, Pt. on home beta blockers + CAD, + Past MI and + CABG     Neuro/Psych    GI/Hepatic   Endo/Other  obese  Renal/GU      Musculoskeletal   Abdominal   Peds  Hematology   Anesthesia Other Findings   Reproductive/Obstetrics                          Anesthesia Physical Anesthesia Plan  ASA: III  Anesthesia Plan: General and Regional   Post-op Pain Management: MAC Combined w/ Regional for Post-op pain   Induction: Intravenous  Airway Management Planned: Oral ETT  Additional Equipment:   Intra-op Plan:   Post-operative Plan: Extubation in OR  Informed Consent: I have reviewed the patients History and Physical, chart, labs and discussed the procedure including the risks, benefits and alternatives for the proposed anesthesia with the patient or authorized representative who has indicated his/her understanding and acceptance.     Plan Discussed with: CRNA, Surgeon and Anesthesiologist  Anesthesia Plan Comments:        Anesthesia Quick Evaluation

## 2011-07-28 NOTE — Anesthesia Procedure Notes (Addendum)
Anesthesia Regional Block:  Popliteal block  Pre-Anesthetic Checklist: ,, timeout performed, Correct Patient, Correct Site, Correct Laterality, Correct Procedure, Correct Position, site marked, Risks and benefits discussed,  Surgical consent,  Pre-op evaluation,  At surgeon's request and post-op pain management  Laterality: Left  Prep: chloraprep       Needles:  Injection technique: Single-shot  Needle Type: Echogenic Stimulator Needle          Additional Needles:  Procedures: ultrasound guided and nerve stimulator Popliteal block  Nerve Stimulator or Paresthesia:  Response: plantar flexion of foot, 0.45 mA,   Additional Responses:   Narrative:  Start time: 07/28/2011 10:35 AM End time: 07/28/2011 10:45 AM Injection made incrementally with aspirations every 5 mL.  Performed by: Personally  Anesthesiologist: Dr Chaney Malling  Additional Notes: Functioning IV was confirmed and monitors were applied.  A 90mm 21ga Arrow echogenic stimulator needle was used. Sterile prep and drape,hand hygiene and sterile gloves were used.  Negative aspiration and negative test dose prior to incremental administration of local anesthetic. The patient tolerated the procedure well.  Ultrasound guidance: relevent anatomy identified, needle position confirmed, local anesthetic spread visualized around nerve(s), vascular puncture avoided.  Image printed for medical record.   Popliteal block Procedure Name: Intubation Date/Time: 07/28/2011 11:01 AM Performed by: Sherie Don Pre-anesthesia Checklist: Patient identified, Emergency Drugs available, Suction available, Patient being monitored and Timeout performed Patient Re-evaluated:Patient Re-evaluated prior to inductionOxygen Delivery Method: Circle system utilized Preoxygenation: Pre-oxygenation with 100% oxygen Intubation Type: IV induction Ventilation: Mask ventilation without difficulty and Oral airway inserted - appropriate to patient  size Laryngoscope Size: Mac and 3 Grade View: Grade I Tube type: Oral Tube size: 7.5 mm Number of attempts: 1 Airway Equipment and Method: Stylet Placement Confirmation: ETT inserted through vocal cords under direct vision,  positive ETCO2 and breath sounds checked- equal and bilateral Secured at: 23 cm Tube secured with: Tape Dental Injury: Teeth and Oropharynx as per pre-operative assessment

## 2011-07-28 NOTE — Anesthesia Postprocedure Evaluation (Signed)
Anesthesia Post Note  Patient: Jacob Costa  Procedure(s) Performed: Procedure(s) (LRB): OPEN REDUCTION INTERNAL FIXATION (ORIF) ANKLE FRACTURE (Left)  Anesthesia type: General  Patient location: PACU  Post pain: Pain level controlled and Adequate analgesia  Post assessment: Post-op Vital signs reviewed, Patient's Cardiovascular Status Stable, Respiratory Function Stable, Patent Airway and Pain level controlled  Last Vitals:  Filed Vitals:   07/28/11 1315  BP: 116/60  Pulse: 74  Temp: 36.7 C  Resp: 16    Post vital signs: Reviewed and stable  Level of consciousness: awake, alert  and oriented  Complications: No apparent anesthesia complications

## 2011-08-02 ENCOUNTER — Encounter (HOSPITAL_COMMUNITY): Payer: Self-pay | Admitting: Orthopedic Surgery

## 2011-08-03 ENCOUNTER — Ambulatory Visit: Payer: Medicare Other | Admitting: Internal Medicine

## 2011-08-11 ENCOUNTER — Encounter: Payer: Self-pay | Admitting: Internal Medicine

## 2011-08-11 ENCOUNTER — Ambulatory Visit (INDEPENDENT_AMBULATORY_CARE_PROVIDER_SITE_OTHER): Payer: Medicare Other | Admitting: Internal Medicine

## 2011-08-11 VITALS — BP 128/70 | HR 64 | Temp 97.4°F | Ht 71.0 in

## 2011-08-11 DIAGNOSIS — I2510 Atherosclerotic heart disease of native coronary artery without angina pectoris: Secondary | ICD-10-CM

## 2011-08-11 DIAGNOSIS — I10 Essential (primary) hypertension: Secondary | ICD-10-CM

## 2011-08-11 DIAGNOSIS — F068 Other specified mental disorders due to known physiological condition: Secondary | ICD-10-CM

## 2011-08-11 DIAGNOSIS — S82843A Displaced bimalleolar fracture of unspecified lower leg, initial encounter for closed fracture: Secondary | ICD-10-CM

## 2011-08-11 NOTE — Assessment & Plan Note (Signed)
Stable without angina - no cardiac events operative 06/2011 events The current medical regimen is effective;  continue present plan and medications.

## 2011-08-11 NOTE — Progress Notes (Signed)
Subjective:    Patient ID: Jacob Costa, male    DOB: 05/23/1932, 78 y.o.   MRN: [id number]  HPI   here for follow up - reviewed chronic medical issues:  L ankle fx 07/01/11 - s/p ext fixation and then ORIF - at SNF for rehab and healing since then - ?home 08/2011 per family - reports pain is ell controlled - follow up planned with ortho on same ([facility])  dementia  - eval 11/2009 for memory loss - problem noted by wife:short term memory trouble - irritability and "neediness" unable to find things that he placed away, occ driving trouble (forgets directions) no headache, weakness; no vision change or trouble speaking, no balance trouble or falls MRI brain 11/2009 neg, started aricept (and b12 shots) - doing well - dose aricept increased 02/2010 Then added remeron 08/2010 due to sleep problems and short temper following death of niece. reports compliance with ongoing medical treatment and no changes in medication dose or frequency. denies adverse side effects related to current therapy.  improved mood and less irritability per the patient and wife  hypertension - the patient reports compliance with medication(s) as prescribed. Denies adverse side effects.   dyslipidemia - on statin -no problems on current meds - no muscle pains or GI upset  CAD - repeat cath 7/10 by dr. Riley Kill - no critical dz found s/p CABG following MI 1/07 no recurrent symptoms or active angina  B12 defic - dx 11/2009 -  Started on shots for same, but opted to change to oral therapy - doing well - see dementia above   Past Medical History  Diagnosis Date  . VITAMIN B12 DEFICIENCY 11/2009 dx  . CORONARY ATHEROSCLEROSIS NATIVE CORONARY ARTERY 02/2005    MI followed by CABGx3  . DYSLIPIDEMIA   . HYPERTENSION   . MYOCARDIAL INFARCTION 02/2005  . OBESITY   . DEMENTIA   . ISCHEMIC CARDIOMYOPATHY   . PERCUTANEOUS TRANSLUMINAL CORONARY ANGIOPLASTY, HX OF      Review of Systems  Constitutional: Negative for  fatigue and unexpected weight change.  Respiratory: Negative for shortness of breath.   Cardiovascular: Negative for chest pain.  Psychiatric/Behavioral: Negative for hallucinations, dysphoric mood and agitation.       Objective:   Physical Exam BP 128/70  Pulse 64  Temp 97.4 F (36.3 C) (Oral)  Ht 5\' 11"  (1.803 m)  SpO2 97%  Wt Readings from Last 3 Encounters:  07/04/11 235 lb (106.595 kg)  07/04/11 235 lb (106.595 kg)  04/06/11 235 lb 12.8 oz (106.958 kg)   Constitutional:  He appears well-developed and well-nourished. No distress. Wife at side. Cardiovascular: Mild brady rate, regular rhythm and normal heart sounds.  No murmur heard. no BLE edema Pulmonary/Chest: Effort normal and breath sounds normal. No respiratory distress. no wheezes.  Neurological: he is alert and oriented to person, place, and time. No cranial nerve deficit. Coordination normal. Good attention and recall. MSkel: LLE anle casted - distal circulation and sensation intact Psyc: pleaseant mood and normal affect. Quiet and reserved but interactive when asked to participate.      Lab Results  Component Value Date   WBC 6.3 07/28/2011   HGB 12.6* 07/28/2011   HCT 38.9* 07/28/2011   PLT 191 07/28/2011   CHOL 124 10/14/2010   TRIG 95.0 10/14/2010   HDL 43.70 10/14/2010   ALT 18 09/02/2010   AST 24 09/02/2010   NA 139 07/28/2011   K 4.6 07/28/2011   CL 100 07/28/2011  CREATININE 0.99 07/28/2011   BUN 13 07/28/2011   CO2 28 07/28/2011   TSH 2.34 09/02/2010   INR 0.9 09/03/2008   HGBA1C 5.9 07/02/2007   Lab Results  Component Value Date   VITAMINB12 1317* 04/06/2011                                       164                                                                 12/15/2009  Assessment & Plan:  See problem list. Medications and labs reviewed today.

## 2011-08-11 NOTE — Assessment & Plan Note (Signed)
note stable bradycardia on current Bbloc dose The current medical regimen is effective;  continue present plan and medications.  BP Readings from Last 3 Encounters:  08/11/11 128/70  07/28/11 116/60  07/28/11 116/60

## 2011-08-11 NOTE — Patient Instructions (Signed)
It was good to see you today. We have reviewed your prior records including labs and tests today Medications reviewed, no changes at this time. Please schedule followup in 3-4 weeks after home to recheck, call sooner if problems.

## 2011-08-11 NOTE — Assessment & Plan Note (Signed)
Reviewed 07/01/11 event: external fixation, then ORIF 07/28/11 Following with ortho, follow up planned Ongoing SNF rehab and [facility], planning home with HHPT and wife ?08/2011 Time spent with pt/family today 25 minutes, greater than 50% time spent counseling patient on medication review. Also review of prior records

## 2011-08-11 NOTE — Assessment & Plan Note (Signed)
Started on Aricept 11/2009; dose increase 02/2010 - also ongoing B12 replacement (oral) MRI brain 11/2009 unremarkable Added remeron 08/2010 for sleep and depression issues - much improved, but weight gain and sedation reviewed with wife No need for ativan prn irritable outbursts since adding remeron  Slight sundowning and delusions periop to be expected in setting of ankle fx/OR and hosp 06/2011 - now back to baseline The current medical regimen is effective;  continue present plan and medications.

## 2011-08-23 ENCOUNTER — Telehealth: Payer: Self-pay

## 2011-08-23 NOTE — Telephone Encounter (Signed)
yes

## 2011-08-23 NOTE — Telephone Encounter (Signed)
Tried calling PT back several times keep getting fast busy not going threw... 08/23/11@5 :11pm/LMB

## 2011-08-23 NOTE — Telephone Encounter (Signed)
PT called to inform MD that she is recommending visits 2/week for 2 weeks to start. PT is requesting verbal authorization to continue as advised, okay for verbal?

## 2011-08-24 NOTE — Telephone Encounter (Signed)
Tried calling PT again still not able to get threw keep getting fast busy. ? Wrong # was left... 08/24/11@8 :33pm/LMB

## 2011-09-08 ENCOUNTER — Ambulatory Visit (INDEPENDENT_AMBULATORY_CARE_PROVIDER_SITE_OTHER): Payer: Medicare Other | Admitting: Internal Medicine

## 2011-09-08 ENCOUNTER — Encounter: Payer: Self-pay | Admitting: Internal Medicine

## 2011-09-08 VITALS — BP 120/76 | HR 55 | Temp 97.8°F | Ht 71.0 in | Wt 226.8 lb

## 2011-09-08 DIAGNOSIS — S82843A Displaced bimalleolar fracture of unspecified lower leg, initial encounter for closed fracture: Secondary | ICD-10-CM

## 2011-09-08 DIAGNOSIS — F068 Other specified mental disorders due to known physiological condition: Secondary | ICD-10-CM

## 2011-09-08 DIAGNOSIS — I2510 Atherosclerotic heart disease of native coronary artery without angina pectoris: Secondary | ICD-10-CM

## 2011-09-08 NOTE — Assessment & Plan Note (Signed)
Stable without angina - no cardiac events perioperative 06/2011 events The current medical regimen is effective;  continue present plan and medications.

## 2011-09-08 NOTE — Assessment & Plan Note (Signed)
Started on Aricept 11/2009; dose increase 02/2010 - also ongoing B12 replacement (oral) MRI brain 11/2009 unremarkable Added remeron 08/2010 for sleep and depression issues - much improved - mild weight gain and mild sedation side effects reviewed with wife No need for ativan prn irritable outbursts since adding remeron  Slight sundowning and delusions periop to be expected in setting of ankle fx/OR and hosp 06/2011 - now back to baseline, smooth transition home The current medical regimen is effective;  continue present plan and medications.

## 2011-09-08 NOTE — Progress Notes (Signed)
Subjective:    Patient ID: Jacob Costa, male    DOB: 03-08-1932, 78 y.o.   MRN: [id number]  HPI   here for follow up - reviewed chronic medical issues:  L ankle fx 07/01/11 - s/p ext fixation and then ORIF - at SNF for rehab and healing since then - home early 08/2011 per family - reports pain is controlled without need for narcotics - follow up planned with ortho on same ([facility]) mid 08/2011  dementia  - eval 11/2009 for memory loss - problem noted by wife:short term memory trouble - irritability and "neediness" unable to find things that he placed away, occ driving trouble (forgets directions) no headache, weakness; no vision change or trouble speaking, no balance trouble or falls MRI brain 11/2009 neg, started aricept (and b12 shots) - doing well -  aricept increased 02/2010, and added remeron 08/2010 due to sleep problems and short temper following death of niece. reports compliance with ongoing medical treatment and no changes in medication dose or frequency. denies adverse side effects related to current therapy.  improved mood and less irritability per the patient and wife  hypertension - the patient reports compliance with medication(s) as prescribed. Denies adverse side effects.   dyslipidemia - on statin -no problems on current meds - no muscle pains or GI upset  CAD - repeat cath 7/10 by dr. Riley Kill - no critical dz found s/p CABG following MI 1/07 no recurrent symptoms or active angina  B12 defic - dx 11/2009 -  Started on shots for same, but opted to change to oral therapy - doing well - see dementia above   Past Medical History  Diagnosis Date  . VITAMIN B12 DEFICIENCY 11/2009 dx  . CORONARY ATHEROSCLEROSIS NATIVE CORONARY ARTERY 02/2005    MI followed by CABGx3  . DYSLIPIDEMIA   . HYPERTENSION   . MYOCARDIAL INFARCTION 02/2005  . OBESITY   . DEMENTIA   . ISCHEMIC CARDIOMYOPATHY   . PERCUTANEOUS TRANSLUMINAL CORONARY ANGIOPLASTY, HX OF      Review of Systems    Constitutional: Negative for fatigue and unexpected weight change.  Respiratory: Negative for shortness of breath.   Cardiovascular: Negative for chest pain.  Psychiatric/Behavioral: Negative for hallucinations, dysphoric mood and agitation.       Objective:   Physical Exam BP 120/76  Pulse 55  Temp 97.8 F (36.6 C) (Oral)  Ht 5\' 11"  (1.803 m)  Wt 226 lb 12.8 oz (102.876 kg)  BMI 31.63 kg/m2  SpO2 97%  Wt Readings from Last 3 Encounters:  09/08/11 226 lb 12.8 oz (102.876 kg)  07/04/11 235 lb (106.595 kg)  07/04/11 235 lb (106.595 kg)   Constitutional:  He appears well-developed and well-nourished. No distress. Wife at side. Cardiovascular: Mild brady rate, regular rhythm and normal heart sounds.  No murmur heard. no BLE edema Pulmonary/Chest: Effort normal and breath sounds normal. No respiratory distress. no wheezes.  Neurological: he is alert and oriented to person, place, and time. No cranial nerve deficit. Coordination normal. Good attention and recall. MSkel: LLE anle casted - distal circulation and sensation intact Psyc: pleaseant mood and normal affect. Quiet and reserved but interactive when asked to participate.      Lab Results  Component Value Date   WBC 6.3 07/28/2011   HGB 12.6* 07/28/2011   HCT 38.9* 07/28/2011   PLT 191 07/28/2011   CHOL 124 10/14/2010   TRIG 95.0 10/14/2010   HDL 43.70 10/14/2010   ALT 18 09/02/2010   AST  24 09/02/2010   NA 139 07/28/2011   K 4.6 07/28/2011   CL 100 07/28/2011   CREATININE 0.99 07/28/2011   BUN 13 07/28/2011   CO2 28 07/28/2011   TSH 2.34 09/02/2010   INR 0.9 09/03/2008   HGBA1C 5.9 07/02/2007   Lab Results  Component Value Date   VITAMINB12 1317* 04/06/2011                                       164                                                                 12/15/2009  Assessment & Plan:  See problem list. Medications and labs reviewed today.

## 2011-09-08 NOTE — Patient Instructions (Signed)
It was good to see you today. We have reviewed your prior records including labs and tests today Medications reviewed, no changes at this time. Please schedule followup in 4 months, call sooner if problems.

## 2011-09-08 NOTE — Assessment & Plan Note (Signed)
Reviewed 07/01/11 event: external fixation, then ORIF 07/28/11 Following with ortho, follow up ongoing Ongoing SNF rehab and [facility], DC home with HHPT and wife 08/27/2011

## 2011-10-11 ENCOUNTER — Other Ambulatory Visit: Payer: Self-pay | Admitting: Internal Medicine

## 2011-11-01 ENCOUNTER — Other Ambulatory Visit: Payer: Self-pay | Admitting: *Deleted

## 2011-11-01 ENCOUNTER — Other Ambulatory Visit: Payer: Self-pay | Admitting: Internal Medicine

## 2011-11-01 MED ORDER — LORAZEPAM 0.5 MG PO TABS: 0.5000 mg | ORAL_TABLET | Freq: Three times a day (TID) | ORAL | Status: DC | PRN

## 2011-11-01 NOTE — Telephone Encounter (Signed)
[facility].../[facility] 

## 2011-11-04 ENCOUNTER — Other Ambulatory Visit: Payer: Self-pay | Admitting: *Deleted

## 2011-11-04 MED ORDER — LORAZEPAM 0.5 MG PO TABS: 0.5000 mg | ORAL_TABLET | Freq: Three times a day (TID) | ORAL | Status: DC | PRN

## 2011-11-04 NOTE — Telephone Encounter (Signed)
[facility].../[facility]mb 

## 2011-12-01 ENCOUNTER — Other Ambulatory Visit: Payer: Self-pay | Admitting: Internal Medicine

## 2011-12-03 ENCOUNTER — Other Ambulatory Visit: Payer: Self-pay | Admitting: Internal Medicine

## 2011-12-19 ENCOUNTER — Other Ambulatory Visit: Payer: Self-pay | Admitting: *Deleted

## 2011-12-19 MED ORDER — RAMIPRIL 2.5 MG PO CAPS: 2.5000 mg | ORAL_CAPSULE | Freq: Every day | ORAL | Status: DC

## 2011-12-30 ENCOUNTER — Other Ambulatory Visit: Payer: Self-pay | Admitting: Internal Medicine

## 2012-01-12 ENCOUNTER — Ambulatory Visit (INDEPENDENT_AMBULATORY_CARE_PROVIDER_SITE_OTHER): Payer: Medicare Other | Admitting: Internal Medicine

## 2012-01-12 ENCOUNTER — Other Ambulatory Visit (INDEPENDENT_AMBULATORY_CARE_PROVIDER_SITE_OTHER): Payer: Medicare Other

## 2012-01-12 ENCOUNTER — Encounter: Payer: Self-pay | Admitting: Internal Medicine

## 2012-01-12 VITALS — BP 120/60 | HR 52 | Temp 97.1°F | Ht 71.0 in | Wt 235.4 lb

## 2012-01-12 DIAGNOSIS — Z23 Encounter for immunization: Secondary | ICD-10-CM

## 2012-01-12 DIAGNOSIS — F068 Other specified mental disorders due to known physiological condition: Secondary | ICD-10-CM

## 2012-01-12 DIAGNOSIS — E785 Hyperlipidemia, unspecified: Secondary | ICD-10-CM

## 2012-01-12 DIAGNOSIS — Z1211 Encounter for screening for malignant neoplasm of colon: Secondary | ICD-10-CM

## 2012-01-12 DIAGNOSIS — I10 Essential (primary) hypertension: Secondary | ICD-10-CM

## 2012-01-12 DIAGNOSIS — Z0000 Encounter for general adult medical examination without abnormal findings: Secondary | ICD-10-CM

## 2012-01-12 LAB — LIPID PANEL
Cholesterol: 164 mg/dL (ref 0–200)
LDL Cholesterol: 92 mg/dL (ref 0–99)
Total CHOL/HDL Ratio: 4

## 2012-01-12 MED ORDER — SIMVASTATIN 20 MG PO TABS
20.0000 mg | ORAL_TABLET | Freq: Every day | ORAL | Status: DC
Start: 2012-01-12 — End: 2012-08-16

## 2012-01-12 NOTE — Patient Instructions (Signed)
It was good to see you today. We have reviewed your prior records including labs and tests today Test(s) ordered today. Your results will be released to MyChart (or called to you) after review, usually within 72hours after test completion. If any changes need to be made, you will be notified at that same time. Health Maintenance reviewed - flu shot today and refer for colonoscopy - all other recommended immunizations and age-appropriate screenings are up-to-date. we'll make referral to [facility] gastroenterology for colonoscopy . Our office will contact you regarding appointment(s) once made. Medications reviewed, no changes at this time. (simvastatin now as 20mg  tabs, instead of splitting 40mg  tabs) Please schedule followup in 6 months, call sooner if problems. Health Maintenance, Males A healthy lifestyle and preventative care can promote health and wellness.  Maintain regular health, dental, and eye exams.   Eat a healthy diet. Foods like vegetables, fruits, whole grains, low-fat dairy products, and lean protein foods contain the nutrients you need without too many calories. Decrease your intake of foods high in solid fats, added sugars, and salt. Get information about a proper diet from your caregiver, if necessary.   Regular physical exercise is one of the most important things you can do for your health. Most adults should get at least 150 minutes of moderate-intensity exercise (any activity that increases your heart rate and causes you to sweat) each week. In addition, most adults need muscle-strengthening exercises on 2 or more days a week.     Maintain a healthy weight. The body mass index (BMI) is a screening tool to identify possible weight problems. It provides an estimate of body fat based on height and weight. Your caregiver can help determine your BMI, and can help you achieve or maintain a healthy weight. For adults 20 years and older:   A BMI below 18.5 is considered underweight.    A BMI of 18.5 to 24.9 is normal.   A BMI of 25 to 29.9 is considered overweight.   A BMI of 30 and above is considered obese.   Maintain normal blood lipids and cholesterol by exercising and minimizing your intake of saturated fat. Eat a balanced diet with plenty of fruits and vegetables. Blood tests for lipids and cholesterol should begin at age 1 and be repeated every 5 years. If your lipid or cholesterol levels are high, you are over 50, or you are a high risk for heart disease, you may need your cholesterol levels checked more frequently. Ongoing high lipid and cholesterol levels should be treated with medicines, if diet and exercise are not effective.   If you smoke, find out from your caregiver how to quit. If you do not use tobacco, do not start.   If you choose to drink alcohol, do not exceed 2 drinks per day. One drink is considered to be 12 ounces (355 mL) of beer, 5 ounces (148 mL) of wine, or 1.5 ounces (44 mL) of liquor.   Avoid use of street drugs. Do not share needles with anyone. Ask for help if you need support or instructions about stopping the use of drugs.   High blood pressure causes heart disease and increases the risk of stroke. Blood pressure should be checked at least every 1 to 2 years. Ongoing high blood pressure should be treated with medicines if weight loss and exercise are not effective.   If you are 58 to 75 years old, ask your caregiver if you should take aspirin to prevent heart disease.  Diabetes screening involves taking a blood sample to check your fasting blood sugar level. This should be done once every 3 years, after age 33, if you are within normal weight and without risk factors for diabetes. Testing should be considered at a younger age or be carried out more frequently if you are overweight and have at least 1 risk factor for diabetes.   Colorectal cancer can be detected and often prevented. Most routine colorectal cancer screening begins at the  age of 68 and continues through age 30. However, your caregiver may recommend screening at an earlier age if you have risk factors for colon cancer. On a yearly basis, your caregiver may provide home test kits to check for hidden blood in the stool. Use of a small camera at the end of a tube, to directly examine the colon (sigmoidoscopy or colonoscopy), can detect the earliest forms of colorectal cancer. Talk to your caregiver about this at age 19, when routine screening begins. Direct examination of the colon should be repeated every 5 to 10 years through age 27, unless early forms of pre-cancerous polyps or small growths are found.   Hepatitis C blood testing is recommended for all people born from [birth year] through 1963 and any individual with known risks for hepatitis C.   Healthy men should no longer receive prostate-specific antigen (PSA) blood tests as part of routine cancer screening. Consult with your caregiver about prostate cancer screening.   Testicular cancer screening is not recommended for adolescents or adult males who have no symptoms. Screening includes self-exam, caregiver exam, and other screening tests. Consult with your caregiver about any symptoms you have or any concerns you have about testicular cancer.   Practice safe sex. Use condoms and avoid high-risk sexual practices to reduce the spread of sexually transmitted infections (STIs).   Use sunscreen with a sun protection factor (SPF) of 30 or greater. Apply sunscreen liberally and repeatedly throughout the day. You should seek shade when your shadow is shorter than you. Protect yourself by wearing long sleeves, pants, a wide-brimmed hat, and sunglasses year round, whenever you are outdoors.   Notify your caregiver of new moles or changes in moles, especially if there is a change in shape or color. Also notify your caregiver if a mole is larger than the size of a pencil eraser.   A one-time screening for abdominal aortic aneurysm  (AAA) and surgical repair of large AAAs by sound wave imaging (ultrasonography) is recommended for ages 63 to 38 years who are current or former smokers.   Stay current with your immunizations.  Document Released: 08/13/2007 Document Revised: 05/09/2011 Document Reviewed: 07/12/2010 [facility] Patient Information 2013 [place], [place].

## 2012-01-12 NOTE — Assessment & Plan Note (Signed)
reduced to simva to 20mg qhs in 09/2010 -  check lipids annually  

## 2012-01-12 NOTE — Assessment & Plan Note (Signed)
note stable bradycardia on current Bbloc dose The current medical regimen is effective;  continue present plan and medications.  BP Readings from Last 3 Encounters:  01/12/12 120/60  09/08/11 120/76  08/11/11 128/70

## 2012-01-12 NOTE — Progress Notes (Signed)
Subjective:    Patient ID: Jacob Costa, male    DOB: [birth year], 79 y.o.   MRN: [id number]  HPI   Here for medicare wellness  Diet: heart healthy  Physical activity: sedentary Depression/mood screen: negative Hearing: intact to whispered voice Visual acuity: grossly normal, performs annual eye exam  ADLs: capable Fall risk: see above Home safety: good Cognitive evaluation: intact to orientation, naming and repetition - 2/3 recall EOL planning: adv directives  I have personally reviewed and have noted 1. The patient's medical and social history 2. Their use of alcohol, tobacco or illicit drugs 3. Their current medications and supplements 4. The patient's functional ability including ADL's, fall risks, home safety risks and hearing or visual impairment. 5. Diet and physical activities 6. Evidence for depression or mood disorders  Also reviewed chronic medical issues:  L ankle fx 07/01/11 - s/p ext fixation and then ORIF - at SNF for rehab and healing since then - home early 08/2011 per family - reports pain is controlled without need for narcotics - follow up planned with ortho on same ([facility]itt) mid 08/2011  dementia  - eval 11/2009 for memory loss - problem noted by wife:short term memory trouble - irritability and "neediness" unable to find things that he placed away, occ driving trouble (forgets directions) no headache, weakness; no vision change or trouble speaking, no balance trouble or falls MRI brain 11/2009 neg, started aricept (and b12 shots) - doing well -  aricept increased 02/2010, and added remeron 08/2010 due to sleep problems and short temper following death of niece. reports compliance with ongoing medical treatment and no changes in medication dose or frequency. denies adverse side effects related to current therapy.  improved mood and less irritability per the patient and wife  hypertension - the patient reports compliance with medication(s) as prescribed. Denies  adverse side effects.   dyslipidemia - on statin -no problems on current meds - no muscle pains or GI upset  CAD - repeat cath 7/10 by dr. Riley Kill - no critical dz found s/p CABG following MI 1/07 no recurrent symptoms or active angina - not actively following with cards since 7/12  B12 defic - dx 11/2009 -  Started on shots for same, but opted to change to oral therapy in 2012 - doing well - see dementia above   Past Medical History  Diagnosis Date  . VITAMIN B12 DEFICIENCY 11/2009 dx  . CORONARY ATHEROSCLEROSIS NATIVE CORONARY ARTERY 02/2005    MI followed by CABGx3  . DYSLIPIDEMIA   . HYPERTENSION   . MYOCARDIAL INFARCTION 02/2005  . OBESITY   . DEMENTIA   . ISCHEMIC CARDIOMYOPATHY   . PERCUTANEOUS TRANSLUMINAL CORONARY ANGIOPLASTY, HX OF    Family History  Problem Relation Age of Onset  . Anesthesia problems Neg Hx    History  Substance Use Topics  . Smoking status: Never Smoker   . Smokeless tobacco: Never Used     Comment: Married-lives with wife, 3 children. Retired Producer, television/film/video PT driving cars  . Alcohol Use: No    Review of Systems  Constitutional: Negative for fatigue and unexpected weight change.  Respiratory: Negative for shortness of breath.   Cardiovascular: Negative for chest pain.  Psychiatric/Behavioral: Negative for hallucinations, dysphoric mood and agitation.  No other specific complaints in a complete review of systems (except as listed in HPI above).      Objective:   Physical Exam BP 120/60  Pulse 52  Temp 97.1 F (36.2 C) (Oral)  Ht 5\' 11"  (1.803 m)  Wt 235 lb 6.4 oz (106.777 kg)  BMI 32.83 kg/m2  SpO2 96%  Wt Readings from Last 3 Encounters:  01/12/12 235 lb 6.4 oz (106.777 kg)  09/08/11 226 lb 12.8 oz (102.876 kg)  07/04/11 235 lb (106.595 kg)   Constitutional:  He is overweight, appears well-developed and well-nourished. No distress. Wife at side. Cardiovascular: Mild brady rate, regular rhythm and normal heart sounds.   No murmur heard. no BLE edema Pulmonary/Chest: Effort normal and breath sounds normal. No respiratory distress. no wheezes.  Neurological: he is alert and oriented to person, place, and time. No cranial nerve deficit. Coordination normal. Good attention and recall. MSkel: no gross deformities Psyc: pleasant mood and normal affect. Quiet and reserved but interactive when asked to participate.      Lab Results  Component Value Date   WBC 6.3 07/28/2011   HGB 12.6* 07/28/2011   HCT 38.9* 07/28/2011   PLT 191 07/28/2011   CHOL 124 10/14/2010   TRIG 95.0 10/14/2010   HDL 43.70 10/14/2010   ALT 18 09/02/2010   AST 24 09/02/2010   NA 139 07/28/2011   K 4.6 07/28/2011   CL 100 07/28/2011   CREATININE 0.99 07/28/2011   BUN 13 07/28/2011   CO2 28 07/28/2011   TSH 2.34 09/02/2010   INR 0.9 09/03/2008   HGBA1C 5.9 07/02/2007   Lab Results  Component Value Date   VITAMINB12 1317* 04/06/2011                                       164                                                                 12/15/2009  Assessment & Plan:  AWV/v70.0 -Today patient counseled on age appropriate routine health concerns for screening and prevention, each reviewed and up to date or declined. Immunizations reviewed and up to date or declined. Labs/ECG reviewed. Risk factors for depression reviewed and negative. Hearing function and visual acuity are intact. ADLs screened and addressed as needed. Functional ability and level of safety reviewed and appropriate. Education, counseling and referrals performed based on assessed risks today. Patient provided with a copy of personalized plan for preventive services.  Also see problem list. Medications and labs reviewed today.

## 2012-01-12 NOTE — Assessment & Plan Note (Signed)
Started on Aricept 11/2009; dose increase 02/2010 - also ongoing B12 replacement (oral) MRI brain 11/2009 unremarkable Added remeron 08/2010 for sleep and depression issues - much improved - mild weight gain and mild sedation side effects have stabilized No need for ativan prn irritable outbursts since adding remeron  Slight sundowning and delusions experienced in setting of ankle fx/OR and hosp 06/2011 with SNF rehab now back to baseline, s/p smooth transition home The current medical regimen is effective;  continue present plan and medications.  

## 2012-01-13 ENCOUNTER — Encounter: Payer: Self-pay | Admitting: Gastroenterology

## 2012-01-17 ENCOUNTER — Other Ambulatory Visit: Payer: Self-pay | Admitting: *Deleted

## 2012-01-17 MED ORDER — RAMIPRIL 2.5 MG PO CAPS: 2.5000 mg | ORAL_CAPSULE | Freq: Every day | ORAL | Status: DC

## 2012-02-23 ENCOUNTER — Other Ambulatory Visit: Payer: Self-pay | Admitting: *Deleted

## 2012-02-23 MED ORDER — RAMIPRIL 2.5 MG PO CAPS: 2.5000 mg | ORAL_CAPSULE | Freq: Every day | ORAL | Status: DC

## 2012-02-27 ENCOUNTER — Telehealth: Payer: Self-pay | Admitting: *Deleted

## 2012-02-27 NOTE — Telephone Encounter (Signed)
Pt scheduled for new pt appt with Willette Cluster 03/02/2012 at 1:30.   Colonoscopy scheduled for 1/16 cancelled; Pv scheduled for 1/2 cancelled.   Pt's wife aware of appointment.

## 2012-02-27 NOTE — Telephone Encounter (Signed)
Nees office visit with MD or PA/NP before colonoscopy to assess.

## 2012-02-27 NOTE — Telephone Encounter (Signed)
Dr Russella Dar:  Pt is scheduled for direct screening colonoscopy 03/15/12. PV scheduled for 03/01/12.  Pt is 78 years old; has not had colonoscopy in past.  He has dementia, CAD with EF of 35%. (ASA IV).  Do you want this pt to have an OV before scheduling direct colonoscopy? If he is okay for direct; does he need to be scheduled at hospital?  Thanks, Olegario Messier

## 2012-02-28 NOTE — Telephone Encounter (Signed)
I contacted the patient to reschedule appt on Friday 03/01/12.  He wants to call me back to reschedule when he has his wife's schedule.  He understands that I have cancelled the appt for 03/02/12 with Willette Cluster RNP due to scheduling conflicts.

## 2012-03-02 ENCOUNTER — Ambulatory Visit: Payer: Medicare Other | Admitting: Nurse Practitioner

## 2012-03-06 ENCOUNTER — Ambulatory Visit (INDEPENDENT_AMBULATORY_CARE_PROVIDER_SITE_OTHER): Payer: Medicare Other | Admitting: Physician Assistant

## 2012-03-06 ENCOUNTER — Encounter: Payer: Self-pay | Admitting: Physician Assistant

## 2012-03-06 VITALS — BP 120/60 | HR 60 | Ht 68.75 in | Wt 236.6 lb

## 2012-03-06 DIAGNOSIS — I2589 Other forms of chronic ischemic heart disease: Secondary | ICD-10-CM

## 2012-03-06 DIAGNOSIS — Z1211 Encounter for screening for malignant neoplasm of colon: Secondary | ICD-10-CM

## 2012-03-06 NOTE — Progress Notes (Signed)
Subjective:    Patient ID: Jacob Costa, male    DOB: June 13, 1932, 79 y.o.   MRN: [id number]  HPI Jacob Costa is a very nice 79 year old white male who is referred today per Dr.Leschber regarding screening colonoscopy. Patient has significant dementia, history of coronary artery disease and an ischemic cardiomyopathy with EF of approximately 35% last documented in 2011. He is status post MI and CABG, also has hypertension and obesity. Apparently the defibrillator was discussed at one point with patient and wife and he declined to have this placed. He has not had any prior colonoscopy, and has no family history of colon cancer . History is gained from the patient's wife as he is unable to provide accurate answers. He denies any abdominal pain, his wife states he has not had any change in bowel habits no melena or hematochezia. He takes a baby aspirin once daily, no other blood thinners.    Review of Systems  Constitutional: Negative.   HENT: Negative.   Eyes: Negative.   Respiratory: Negative.   Cardiovascular: Negative.   Gastrointestinal: Negative.   Genitourinary: Negative.   Musculoskeletal: Negative.   Skin: Negative.   Neurological: Negative.   Hematological: Negative.   Psychiatric/Behavioral: Positive for confusion and decreased concentration.   Outpatient Prescriptions Prior to Visit  Medication Sig Dispense Refill  . aspirin 81 MG tablet Take 81 mg by mouth daily.       Marland Kitchen donepezil (ARICEPT) 10 MG tablet TAKE 1 TABLET (10 MG TOTAL) BY MOUTH AT BEDTIME.  30 tablet  5  . LORazepam (ATIVAN) 0.5 MG tablet Take 1 tablet (0.5 mg total) by mouth every 8 (eight) hours as needed. For extreme agitation  30 tablet  5  . metoprolol tartrate (LOPRESSOR) 25 MG tablet TAKE 1 TABLET BY MOUTH TWICE A DAY  60 tablet  5  . mirtazapine (REMERON) 7.5 MG tablet TAKE 1 TABLET BY MOUTH AT BEDTIME.  30 tablet  5  . ramipril (ALTACE) 2.5 MG capsule Take 1 capsule (2.5 mg total) by mouth daily.  30 capsule   1  . senna (SENOKOT) 8.6 MG TABS Take 1 tablet (8.6 mg total) by mouth 2 (two) times daily.  120 each    . simvastatin (ZOCOR) 20 MG tablet Take 1 tablet (20 mg total) by mouth at bedtime.  30 tablet  5  . vitamin B-12 (CYANOCOBALAMIN) 1000 MCG tablet Take 1,000 mcg by mouth daily.         Last reviewed on 03/06/2012  1:28 PM by Sammuel Cooper, PA  No Known Allergies Patient Active Problem List  Diagnosis  . VITAMIN B12 DEFICIENCY  . DYSLIPIDEMIA  . OBESITY  . HYPERTENSION  . MYOCARDIAL INFARCTION  . CORONARY ARTERY DISEASE  . ISCHEMIC CARDIOMYOPATHY  . PERCUTANEOUS TRANSLUMINAL CORONARY ANGIOPLASTY, HX OF  . DEMENTIA  . Bimalleolar fracture     family history includes Diabetes in his sister; Heart attack in his father; Heart disease in an unspecified family member; and Stroke in his mother.  There is no history of Anesthesia problems and Colon cancer. History  Substance Use Topics  . Smoking status: Never Smoker   . Smokeless tobacco: Current User    Types: Chew     Comment: Married-lives with wife, 3 children. Retired Producer, television/film/video PT driving cars  . Alcohol Use: No    Objective:   Physical Exam  well-developed elderly white male in no acute distress, accompanied by his wife. Patient is a pleasant but offers very  little verbal history. Blood pressure 120/60 pulse 60 height 5 foot 8 weight 236. HEENT; nontraumatic normocephalic EOMI PERRLA sclera anicteric,Neck; Supple no JVD, Cardiovascular; regular rate and rhythm with S1-S2 there is a soft systolic murmur, Pulmonary; clear bilaterally, Abdomen ;soft nontender nondistended bowel sounds are active there is no palpable mass or hepatosplenomegaly, Rectal; exam not done, Extremities; no clubbing cyanosis or edema skin warm dry, Psych; mood and affect appropriate patient is demented but pleasant.        Assessment & Plan:  #35  79 year old white male referred regarding screening colonoscopy. Patient has not had any prior  colonoscopic evaluation, he is currently asymptomatic and family history is negative. He has dementia, and has significant cardiac disease including an ischemic cardiomyopathy with an EF of a proximally 35% when last checked about 18 months ago. Patient is a high-risk screening candidate and due  to his age and ischemic cardiomyopathy screening colonoscopy is not indicated . Plan; risks and benefits of screening colonoscopy discussed in detail with the patient's wife and she understands increased risk of sedation etc. given his history . Will check Hemoccult cards, and assuming negative no further is planned.

## 2012-03-06 NOTE — Patient Instructions (Addendum)
You have been given a separate informational sheet regarding your tobacco use, the importance of quitting and local resources to help you quit. We have given you a stool sample card. Please follow instructions.  Put small amount of stool on card. ( There are 3 cards to use.)   Do not put stool on more than 1 card.  Do 1 bowel movement for each card.  Follow up as needed.

## 2012-03-06 NOTE — Progress Notes (Signed)
Reviewed and agree with management plan.  Gaylord Seydel T. Kendricks Reap, MD FACG 

## 2012-03-14 ENCOUNTER — Encounter: Payer: Medicare Other | Admitting: Internal Medicine

## 2012-03-15 ENCOUNTER — Encounter: Payer: Medicare Other | Admitting: Gastroenterology

## 2012-04-24 ENCOUNTER — Other Ambulatory Visit: Payer: Self-pay | Admitting: *Deleted

## 2012-04-24 MED ORDER — RAMIPRIL 2.5 MG PO CAPS: 2.5000 mg | ORAL_CAPSULE | Freq: Every day | ORAL | Status: DC

## 2012-05-07 ENCOUNTER — Other Ambulatory Visit: Payer: Self-pay | Admitting: Internal Medicine

## 2012-05-24 ENCOUNTER — Other Ambulatory Visit: Payer: Self-pay | Admitting: Internal Medicine

## 2012-05-25 NOTE — Telephone Encounter (Signed)
Lorazepam called to pharm

## 2012-06-12 ENCOUNTER — Other Ambulatory Visit: Payer: Self-pay | Admitting: Internal Medicine

## 2012-06-14 ENCOUNTER — Other Ambulatory Visit: Payer: Self-pay | Admitting: Internal Medicine

## 2012-06-27 ENCOUNTER — Other Ambulatory Visit: Payer: Self-pay | Admitting: Internal Medicine

## 2012-06-28 ENCOUNTER — Other Ambulatory Visit: Payer: Self-pay | Admitting: Internal Medicine

## 2012-06-29 ENCOUNTER — Other Ambulatory Visit: Payer: Self-pay | Admitting: *Deleted

## 2012-06-29 MED ORDER — RAMIPRIL 2.5 MG PO CAPS: 2.5000 mg | ORAL_CAPSULE | Freq: Every day | ORAL | Status: DC

## 2012-07-11 ENCOUNTER — Encounter: Payer: Self-pay | Admitting: Internal Medicine

## 2012-07-11 ENCOUNTER — Ambulatory Visit (INDEPENDENT_AMBULATORY_CARE_PROVIDER_SITE_OTHER): Payer: Medicare Other | Admitting: Internal Medicine

## 2012-07-11 ENCOUNTER — Other Ambulatory Visit (INDEPENDENT_AMBULATORY_CARE_PROVIDER_SITE_OTHER): Payer: Medicare Other

## 2012-07-11 VITALS — BP 112/62 | HR 49 | Temp 97.2°F | Wt 242.0 lb

## 2012-07-11 DIAGNOSIS — R195 Other fecal abnormalities: Secondary | ICD-10-CM

## 2012-07-11 DIAGNOSIS — I10 Essential (primary) hypertension: Secondary | ICD-10-CM

## 2012-07-11 DIAGNOSIS — R5383 Other fatigue: Secondary | ICD-10-CM

## 2012-07-11 DIAGNOSIS — R5381 Other malaise: Secondary | ICD-10-CM

## 2012-07-11 DIAGNOSIS — F068 Other specified mental disorders due to known physiological condition: Secondary | ICD-10-CM

## 2012-07-11 LAB — BASIC METABOLIC PANEL
BUN: 13 mg/dL (ref 6–23)
CO2: 29 mEq/L (ref 19–32)
Calcium: 8.8 mg/dL (ref 8.4–10.5)
Creatinine, Ser: 1.2 mg/dL (ref 0.4–1.5)
Glucose, Bld: 88 mg/dL (ref 70–99)

## 2012-07-11 LAB — CBC WITH DIFFERENTIAL/PLATELET
Basophils Absolute: 0.1 10*3/uL (ref 0.0–0.1)
Eosinophils Absolute: 0.2 10*3/uL (ref 0.0–0.7)
Hemoglobin: 13.6 g/dL (ref 13.0–17.0)
Lymphocytes Relative: 20.7 % (ref 12.0–46.0)
MCHC: 33.7 g/dL (ref 30.0–36.0)
Neutro Abs: 4.7 10*3/uL (ref 1.4–7.7)
Neutrophils Relative %: 62.3 % (ref 43.0–77.0)
Platelets: 179 10*3/uL (ref 150.0–400.0)
RDW: 13.2 % (ref 11.5–14.6)

## 2012-07-11 LAB — TSH: TSH: 2.92 u[IU]/mL (ref 0.35–5.50)

## 2012-07-11 LAB — HEPATIC FUNCTION PANEL
ALT: 11 U/L (ref 0–53)
AST: 20 U/L (ref 0–37)
Alkaline Phosphatase: 86 U/L (ref 39–117)
Bilirubin, Direct: 0.1 mg/dL (ref 0.0–0.3)
Total Bilirubin: 0.6 mg/dL (ref 0.3–1.2)

## 2012-07-11 MED ORDER — RAMIPRIL 2.5 MG PO CAPS: 2.5000 mg | ORAL_CAPSULE | Freq: Every day | ORAL | Status: DC

## 2012-07-11 NOTE — Progress Notes (Signed)
Subjective:    Patient ID: Jacob Costa, male    DOB: October 17, 1932, 79 y.o.   MRN: [id number]  HPI  Here for follow up - reviewed chronic medical issues:  Past Medical History  Diagnosis Date  . VITAMIN B12 DEFICIENCY 11/2009 dx  . CORONARY ATHEROSCLEROSIS NATIVE CORONARY ARTERY 02/2005    MI followed by CABGx3  . DYSLIPIDEMIA   . HYPERTENSION   . MYOCARDIAL INFARCTION 02/2005  . OBESITY   . DEMENTIA   . ISCHEMIC CARDIOMYOPATHY   . PERCUTANEOUS TRANSLUMINAL CORONARY ANGIOPLASTY, HX OF     Review of Systems  Constitutional: Positive for fatigue. Negative for unexpected weight change.  Respiratory: Negative for shortness of breath.   Cardiovascular: Negative for chest pain.  Gastrointestinal: Positive for diarrhea (loose stool x 3-47months, occ associated with bowel incont). Negative for nausea, vomiting, abdominal pain, blood in stool and rectal pain.  Psychiatric/Behavioral: Negative for hallucinations, dysphoric mood and agitation.       Objective:   Physical Exam BP 112/62  Pulse 49  Temp(Src) 97.2 F (36.2 C) (Oral)  Wt 242 lb (109.77 kg)  BMI 36.01 kg/m2  SpO2 96%  Wt Readings from Last 3 Encounters:  07/11/12 242 lb (109.77 kg)  03/06/12 236 lb 9.6 oz (107.321 kg)  01/12/12 235 lb 6.4 oz (106.777 kg)   Constitutional:  He is overweight, appears well-developed and well-nourished. No distress. Wife at side. Cardiovascular: Mild brady rate, regular rhythm and normal heart sounds.  No murmur heard. no BLE edema Pulmonary/Chest: Effort normal and breath sounds normal. No respiratory distress. no wheezes.  Abdomen: protuberant, soft, nontender. No rebound or guarding. Bowel sounds present throughout. No mass Neurological: he is alert and oriented to person, place, and time. No cranial nerve deficit. Coordination normal. Good attention and recall. Psyc: pleasant mood and normal affect. Quiet and reserved but interactive when asked to participate.      Lab Results   Component Value Date   WBC 6.3 07/28/2011   HGB 12.6* 07/28/2011   HCT 38.9* 07/28/2011   PLT 191 07/28/2011   CHOL 164 01/12/2012   TRIG 158.0* 01/12/2012   HDL 40.60 01/12/2012   ALT 18 09/02/2010   AST 24 09/02/2010   NA 139 07/28/2011   K 4.6 07/28/2011   CL 100 07/28/2011   CREATININE 0.99 07/28/2011   BUN 13 07/28/2011   CO2 28 07/28/2011   TSH 2.34 09/02/2010   INR 0.9 09/03/2008   HGBA1C 5.9 07/02/2007   Lab Results  Component Value Date   VITAMINB12 1317* 04/06/2011                                       164                                                                 12/15/2009  Assessment & Plan:    see problem list. Medications and labs reviewed today.  Fatigue - nonspecific symptoms/exam - check screening labs  Loose stool with bowel incont q2-3 weeks - ?dietary related -  check labs as above - exam and hx benign  no colo ever pursued (see GI note 04/2012) -  Advised  trial diet changes: 2 weeks no artificial sugar and 2 weeks no dairy

## 2012-07-11 NOTE — Patient Instructions (Signed)
It was good to see you today. We have reviewed your prior records including labs and tests today Medications reviewed and updated, no changes recommended at this time. Test(s) ordered today. Your results will be released to MyChart (or called to you) after review, usually within 72hours after test completion. If any changes need to be made, you will be notified at that same time. Try diet to avoids sugar substitute and sugar-free foods x2 weeks, then no dairy x2 weeks - each of these food types can affect your digestion and bowel movements - and let us know if bowels remain loose or become worse Please schedule followup in 6 months, call sooner if problems. Application for handicap tag signed and given to you today

## 2012-07-11 NOTE — Assessment & Plan Note (Signed)
note stable bradycardia on current Bbloc dose The current medical regimen is effective;  continue present plan and medications.  BP Readings from Last 3 Encounters:  07/11/12 112/62  03/06/12 120/60  01/12/12 120/60

## 2012-07-11 NOTE — Assessment & Plan Note (Signed)
Started on Aricept 11/2009; dose increase 02/2010 - also ongoing B12 replacement (oral) MRI brain 11/2009 unremarkable Added remeron 08/2010 for sleep and depression issues - much improved - mild weight gain and mild sedation side effects have stabilized No need for ativan prn irritable outbursts since adding remeron  Slight sundowning and delusions experienced in setting of ankle fx/OR and hosp 06/2011 with SNF rehab now back to baseline, s/p smooth transition home The current medical regimen is effective;  continue present plan and medications.  

## 2012-08-16 ENCOUNTER — Other Ambulatory Visit: Payer: Self-pay | Admitting: Internal Medicine

## 2012-08-29 ENCOUNTER — Other Ambulatory Visit: Payer: Self-pay | Admitting: Internal Medicine

## 2012-08-30 NOTE — Telephone Encounter (Signed)
Faxed script bck to [facility].../lmb 

## 2012-11-21 ENCOUNTER — Other Ambulatory Visit: Payer: Self-pay | Admitting: Internal Medicine

## 2013-01-02 ENCOUNTER — Other Ambulatory Visit: Payer: Self-pay | Admitting: Internal Medicine

## 2013-01-06 ENCOUNTER — Other Ambulatory Visit: Payer: Self-pay | Admitting: Internal Medicine

## 2013-01-11 ENCOUNTER — Encounter: Payer: Self-pay | Admitting: Internal Medicine

## 2013-01-11 ENCOUNTER — Ambulatory Visit (INDEPENDENT_AMBULATORY_CARE_PROVIDER_SITE_OTHER): Payer: Medicare Other | Admitting: Internal Medicine

## 2013-01-11 VITALS — BP 132/70 | HR 52 | Temp 97.0°F | Wt 249.0 lb

## 2013-01-11 DIAGNOSIS — Z23 Encounter for immunization: Secondary | ICD-10-CM

## 2013-01-11 DIAGNOSIS — E785 Hyperlipidemia, unspecified: Secondary | ICD-10-CM

## 2013-01-11 DIAGNOSIS — I2589 Other forms of chronic ischemic heart disease: Secondary | ICD-10-CM

## 2013-01-11 DIAGNOSIS — I10 Essential (primary) hypertension: Secondary | ICD-10-CM

## 2013-01-11 DIAGNOSIS — Z0000 Encounter for general adult medical examination without abnormal findings: Secondary | ICD-10-CM

## 2013-01-11 MED ORDER — METOPROLOL SUCCINATE ER 50 MG PO TB24: 50.0000 mg | ORAL_TABLET | Freq: Every day | ORAL | Status: DC

## 2013-01-11 NOTE — Assessment & Plan Note (Signed)
Last echo 08/2009 - LVEF 35% Ongoing medical management and euvolemic Check labs and Echo now for updated EF Not felt to be candidate for ICD given dementia

## 2013-01-11 NOTE — Progress Notes (Signed)
Subjective:    Patient ID: Jacob Costa, male    DOB: 07-Aug-1932, 79 y.o.   MRN: [id number]  HPI  Here for medicare wellness  Diet: heart healthy or DM if diabetic Physical activity: sedentary Depression/mood screen: negative Hearing: intact to whispered voice Visual acuity: grossly normal, performs annual eye exam  ADLs: capable Fall risk: none Home safety: good Cognitive evaluation: intact to orientation, naming, recall and repetition EOL planning: adv directives, full code/ I agree  I have personally reviewed and have noted 1. The patient's medical and social history 2. Their use of alcohol, tobacco or illicit drugs 3. Their current medications and supplements 4. The patient's functional ability including ADL's, fall risks, home safety risks and hearing or visual impairment. 5. Diet and physical activities 6. Evidence for depression or mood disorders  also reviewed chronic medical issues:  Past Medical History  Diagnosis Date  . VITAMIN B12 DEFICIENCY 11/2009 dx  . CORONARY ATHEROSCLEROSIS NATIVE CORONARY ARTERY 02/2005    MI followed by CABGx3  . DYSLIPIDEMIA   . HYPERTENSION   . MYOCARDIAL INFARCTION 02/2005  . OBESITY   . DEMENTIA   . ISCHEMIC CARDIOMYOPATHY   . PERCUTANEOUS TRANSLUMINAL CORONARY ANGIOPLASTY, HX OF    Family History  Problem Relation Age of Onset  . Anesthesia problems Neg Hx   . Diabetes Sister   . Heart disease      siblings  . Heart attack Father   . Stroke Mother   . Colon cancer Neg Hx    History  Substance Use Topics  . Smoking status: Never Smoker   . Smokeless tobacco: Current User    Types: Chew     Comment: Married-lives with wife, 3 children. Retired Producer, television/film/video PT driving cars  . Alcohol Use: No    Review of Systems  Unable to perform ROS Constitutional: Positive for fatigue. Negative for unexpected weight change.  Respiratory: Negative for shortness of breath (occ "heavy" breathin spells at rest, but no  distress).   Cardiovascular: Negative for chest pain.  Gastrointestinal: Negative for nausea, vomiting and abdominal pain.  Genitourinary: Negative for frequency.  Psychiatric/Behavioral: Negative for hallucinations, dysphoric mood and agitation.       Objective:   Physical Exam BP 132/70  Pulse 52  Temp(Src) 97 F (36.1 C) (Oral)  Wt 249 lb (112.946 kg)  SpO2 96%  Wt Readings from Last 3 Encounters:  01/11/13 249 lb (112.946 kg)  07/11/12 242 lb (109.77 kg)  03/06/12 236 lb 9.6 oz (107.321 kg)   Constitutional:  He is overweight, appears well-developed and well-nourished. No distress. Wife at side. Cardiovascular: Mild brady rate, regular rhythm and normal heart sounds.  No murmur heard. no BLE edema Pulmonary/Chest: Effort normal and breath sounds normal. No respiratory distress. no wheezes.  Abdomen: protuberant, soft, nontender. No rebound or guarding. Bowel sounds present throughout. No mass Neurological: he is alert and oriented to person, place, and time. No cranial nerve deficit. Coordination normal. Good attention and recall. Psyc: pleasant mood and normal affect. Quiet and reserved but interactive when asked to participate.      Lab Results  Component Value Date   WBC 7.5 07/11/2012   HGB 13.6 07/11/2012   HCT 40.3 07/11/2012   PLT 179.0 07/11/2012   CHOL 164 01/12/2012   TRIG 158.0* 01/12/2012   HDL 40.60 01/12/2012   ALT 11 07/11/2012   AST 20 07/11/2012   NA 140 07/11/2012   K 4.3 07/11/2012   CL 103 07/11/2012  CREATININE 1.2 07/11/2012   BUN 13 07/11/2012   CO2 29 07/11/2012   TSH 2.92 07/11/2012   INR 0.9 09/03/2008   HGBA1C 5.9 07/02/2007   Lab Results  Component Value Date   VITAMINB12 1317* 04/06/2011                                                                          12/15/2009    Vit B12 = 164  Assessment & Plan:   AWV/v70.0 - Today patient counseled on age appropriate routine health concerns for screening and prevention, each reviewed and up to  date or declined. Immunizations reviewed and up to date or declined. Labs reviewed. Risk factors for depression reviewed and negative. Hearing function and visual acuity are intact. ADLs screened and addressed as needed. Functional ability and level of safety reviewed and appropriate. Education, counseling and referrals performed based on assessed risks today. Patient provided with a copy of personalized plan for preventive services.  also see problem list. Medications and labs reviewed today.

## 2013-01-11 NOTE — Patient Instructions (Addendum)
It was good to see you today.  Your annual flu shot and new pneumonia shot was given and/or updated today.  We have reviewed your prior records including labs and tests today  Test(s) ordered today. Your results will be released to MyChart (or called to you) after review, usually within 72hours after test completion. If any changes need to be made, you will be notified at that same time.  we'll make referral for cardiac ultrasound . Our office will contact you regarding appointment(s) once made.  Medications reviewed - change to long acting lopressor (50mg  once daily instead of 25 twice daily), no other changes at this time.  May consider increase in simva back to 40mg  if cholesterol too high  Please schedule followup in 6 months, call sooner if problems.     Health Maintenance, Males A healthy lifestyle and preventative care can promote health and wellness.  Maintain regular health, dental, and eye exams.  Eat a healthy diet. Foods like vegetables, fruits, whole grains, low-fat dairy products, and lean protein foods contain the nutrients you need without too many calories. Decrease your intake of foods high in solid fats, added sugars, and salt. Get information about a proper diet from your caregiver, if necessary.  Regular physical exercise is one of the most important things you can do for your health. Most adults should get at least 150 minutes of moderate-intensity exercise (any activity that increases your heart rate and causes you to sweat) each week. In addition, most adults need muscle-strengthening exercises on 2 or more days a week.   Maintain a healthy weight. The body mass index (BMI) is a screening tool to identify possible weight problems. It provides an estimate of body fat based on height and weight. Your caregiver can help determine your BMI, and can help you achieve or maintain a healthy weight. For adults 20 years and older:  A BMI below 18.5 is considered  underweight.  A BMI of 18.5 to 24.9 is normal.  A BMI of 25 to 29.9 is considered overweight.  A BMI of 30 and above is considered obese.  Maintain normal blood lipids and cholesterol by exercising and minimizing your intake of saturated fat. Eat a balanced diet with plenty of fruits and vegetables. Blood tests for lipids and cholesterol should begin at age 17 and be repeated every 5 years. If your lipid or cholesterol levels are high, you are over 50, or you are a high risk for heart disease, you may need your cholesterol levels checked more frequently.Ongoing high lipid and cholesterol levels should be treated with medicines, if diet and exercise are not effective.  If you smoke, find out from your caregiver how to quit. If you do not use tobacco, do not start.  Lung cancer screening is recommended for adults aged 66 80 years who are at high risk for developing lung cancer because of a history of smoking. Yearly low-dose computed tomography (CT) is recommended for people who have at least a 30-pack-year history of smoking and are a current smoker or have quit within the past 15 years. A pack year of smoking is smoking an average of 1 pack of cigarettes a day for 1 year (for example: 1 pack a day for 30 years or 2 packs a day for 15 years). Yearly screening should continue until the smoker has stopped smoking for at least 15 years. Yearly screening should also be stopped for people who develop a health problem that would prevent them from having  lung cancer treatment.  If you choose to drink alcohol, do not exceed 2 drinks per day. One drink is considered to be 12 ounces (355 mL) of beer, 5 ounces (148 mL) of wine, or 1.5 ounces (44 mL) of liquor.  Avoid use of street drugs. Do not share needles with anyone. Ask for help if you need support or instructions about stopping the use of drugs.  High blood pressure causes heart disease and increases the risk of stroke. Blood pressure should be checked  at least every 1 to 2 years. Ongoing high blood pressure should be treated with medicines if weight loss and exercise are not effective.  If you are 25 to 77 years old, ask your caregiver if you should take aspirin to prevent heart disease.  Diabetes screening involves taking a blood sample to check your fasting blood sugar level. This should be done once every 3 years, after age 27, if you are within normal weight and without risk factors for diabetes. Testing should be considered at a younger age or be carried out more frequently if you are overweight and have at least 1 risk factor for diabetes.  Colorectal cancer can be detected and often prevented. Most routine colorectal cancer screening begins at the age of 63 and continues through age 50. However, your caregiver may recommend screening at an earlier age if you have risk factors for colon cancer. On a yearly basis, your caregiver may provide home test kits to check for hidden blood in the stool. Use of a small camera at the end of a tube, to directly examine the colon (sigmoidoscopy or colonoscopy), can detect the earliest forms of colorectal cancer. Talk to your caregiver about this at age 60, when routine screening begins. Direct examination of the colon should be repeated every 5 to 10 years through age 23, unless early forms of pre-cancerous polyps or small growths are found.  Hepatitis C blood testing is recommended for all people born from [birth year] through 1965 and any individual with known risks for hepatitis C.  Healthy men should no longer receive prostate-specific antigen (PSA) blood tests as part of routine cancer screening. Consult with your caregiver about prostate cancer screening.  Testicular cancer screening is not recommended for adolescents or adult males who have no symptoms. Screening includes self-exam, caregiver exam, and other screening tests. Consult with your caregiver about any symptoms you have or any concerns you have about  testicular cancer.  Practice safe sex. Use condoms and avoid high-risk sexual practices to reduce the spread of sexually transmitted infections (STIs).  Use sunscreen. Apply sunscreen liberally and repeatedly throughout the day. You should seek shade when your shadow is shorter than you. Protect yourself by wearing long sleeves, pants, a wide-brimmed hat, and sunglasses year round, whenever you are outdoors.  Notify your caregiver of new moles or changes in moles, especially if there is a change in shape or color. Also notify your caregiver if a mole is larger than the size of a pencil eraser.  A one-time screening for abdominal aortic aneurysm (AAA) and surgical repair of large AAAs by sound wave imaging (ultrasonography) is recommended for ages 42 to 33 years who are current or former smokers.  Stay current with your immunizations. Document Released: 08/13/2007 Document Revised: 06/11/2012 Document Reviewed: 07/12/2010 [facility] Patient Information 2014 [place], [place]. Health Maintenance, Males A healthy lifestyle and preventative care can promote health and wellness.  Maintain regular health, dental, and eye exams.  Eat a healthy diet. Foods  like vegetables, fruits, whole grains, low-fat dairy products, and lean protein foods contain the nutrients you need without too many calories. Decrease your intake of foods high in solid fats, added sugars, and salt. Get information about a proper diet from your caregiver, if necessary.  Regular physical exercise is one of the most important things you can do for your health. Most adults should get at least 150 minutes of moderate-intensity exercise (any activity that increases your heart rate and causes you to sweat) each week. In addition, most adults need muscle-strengthening exercises on 2 or more days a week.   Maintain a healthy weight. The body mass index (BMI) is a screening tool to identify possible weight problems. It provides an estimate of  body fat based on height and weight. Your caregiver can help determine your BMI, and can help you achieve or maintain a healthy weight. For adults 20 years and older:  A BMI below 18.5 is considered underweight.  A BMI of 18.5 to 24.9 is normal.  A BMI of 25 to 29.9 is considered overweight.  A BMI of 30 and above is considered obese.  Maintain normal blood lipids and cholesterol by exercising and minimizing your intake of saturated fat. Eat a balanced diet with plenty of fruits and vegetables. Blood tests for lipids and cholesterol should begin at age 13 and be repeated every 5 years. If your lipid or cholesterol levels are high, you are over 50, or you are a high risk for heart disease, you may need your cholesterol levels checked more frequently.Ongoing high lipid and cholesterol levels should be treated with medicines, if diet and exercise are not effective.  If you smoke, find out from your caregiver how to quit. If you do not use tobacco, do not start.  Lung cancer screening is recommended for adults aged 34 80 years who are at high risk for developing lung cancer because of a history of smoking. Yearly low-dose computed tomography (CT) is recommended for people who have at least a 30-pack-year history of smoking and are a current smoker or have quit within the past 15 years. A pack year of smoking is smoking an average of 1 pack of cigarettes a day for 1 year (for example: 1 pack a day for 30 years or 2 packs a day for 15 years). Yearly screening should continue until the smoker has stopped smoking for at least 15 years. Yearly screening should also be stopped for people who develop a health problem that would prevent them from having lung cancer treatment.  If you choose to drink alcohol, do not exceed 2 drinks per day. One drink is considered to be 12 ounces (355 mL) of beer, 5 ounces (148 mL) of wine, or 1.5 ounces (44 mL) of liquor.  Avoid use of street drugs. Do not share needles with  anyone. Ask for help if you need support or instructions about stopping the use of drugs.  High blood pressure causes heart disease and increases the risk of stroke. Blood pressure should be checked at least every 1 to 2 years. Ongoing high blood pressure should be treated with medicines if weight loss and exercise are not effective.  If you are 30 to 77 years old, ask your caregiver if you should take aspirin to prevent heart disease.  Diabetes screening involves taking a blood sample to check your fasting blood sugar level. This should be done once every 3 years, after age 25, if you are within normal weight and without  risk factors for diabetes. Testing should be considered at a younger age or be carried out more frequently if you are overweight and have at least 1 risk factor for diabetes.  Colorectal cancer can be detected and often prevented. Most routine colorectal cancer screening begins at the age of 41 and continues through age 8. However, your caregiver may recommend screening at an earlier age if you have risk factors for colon cancer. On a yearly basis, your caregiver may provide home test kits to check for hidden blood in the stool. Use of a small camera at the end of a tube, to directly examine the colon (sigmoidoscopy or colonoscopy), can detect the earliest forms of colorectal cancer. Talk to your caregiver about this at age 65, when routine screening begins. Direct examination of the colon should be repeated every 5 to 10 years through age 6, unless early forms of pre-cancerous polyps or small growths are found.  Hepatitis C blood testing is recommended for all people born from [birth year] through 1963 and any individual with known risks for hepatitis C.  Healthy men should no longer receive prostate-specific antigen (PSA) blood tests as part of routine cancer screening. Consult with your caregiver about prostate cancer screening.  Testicular cancer screening is not recommended for  adolescents or adult males who have no symptoms. Screening includes self-exam, caregiver exam, and other screening tests. Consult with your caregiver about any symptoms you have or any concerns you have about testicular cancer.  Practice safe sex. Use condoms and avoid high-risk sexual practices to reduce the spread of sexually transmitted infections (STIs).  Use sunscreen. Apply sunscreen liberally and repeatedly throughout the day. You should seek shade when your shadow is shorter than you. Protect yourself by wearing long sleeves, pants, a wide-brimmed hat, and sunglasses year round, whenever you are outdoors.  Notify your caregiver of new moles or changes in moles, especially if there is a change in shape or color. Also notify your caregiver if a mole is larger than the size of a pencil eraser.  A one-time screening for abdominal aortic aneurysm (AAA) and surgical repair of large AAAs by sound wave imaging (ultrasonography) is recommended for ages 22 to 55 years who are current or former smokers.  Stay current with your immunizations. Document Released: 08/13/2007 Document Revised: 06/11/2012 Document Reviewed: 07/12/2010 [facility] Patient Information 2014 [place], [place].

## 2013-01-11 NOTE — Progress Notes (Signed)
Pre-visit discussion using our clinic review tool. No additional management support is needed unless otherwise documented below in the visit note.  

## 2013-01-11 NOTE — Assessment & Plan Note (Signed)
note stable bradycardia on current Bbloc dose The current medical regimen is effective;  continue present plan and medications.  BP Readings from Last 3 Encounters:  01/11/13 132/70  07/11/12 112/62  03/06/12 120/60

## 2013-01-11 NOTE — Assessment & Plan Note (Signed)
reduced to simva to 20mg qhs in 09/2010 -  check lipids annually  

## 2013-02-26 ENCOUNTER — Other Ambulatory Visit: Payer: Self-pay | Admitting: Internal Medicine

## 2013-03-04 ENCOUNTER — Other Ambulatory Visit: Payer: Self-pay | Admitting: Internal Medicine

## 2013-03-05 ENCOUNTER — Other Ambulatory Visit: Payer: Self-pay | Admitting: *Deleted

## 2013-03-05 MED ORDER — SIMVASTATIN 20 MG PO TABS: ORAL_TABLET | ORAL | Status: DC

## 2013-03-05 NOTE — Telephone Encounter (Signed)
Needing a refill on his simvastatin...Raechel Chute/lmb

## 2013-03-26 ENCOUNTER — Other Ambulatory Visit: Payer: Self-pay | Admitting: Internal Medicine

## 2013-05-28 ENCOUNTER — Other Ambulatory Visit: Payer: Self-pay | Admitting: Internal Medicine

## 2013-06-05 ENCOUNTER — Other Ambulatory Visit: Payer: Self-pay | Admitting: Internal Medicine

## 2013-06-05 NOTE — Telephone Encounter (Signed)
[facility].../[facility]mb 

## 2013-07-02 ENCOUNTER — Other Ambulatory Visit: Payer: Self-pay | Admitting: Internal Medicine

## 2013-07-04 ENCOUNTER — Ambulatory Visit (INDEPENDENT_AMBULATORY_CARE_PROVIDER_SITE_OTHER): Payer: Medicare Other | Admitting: Internal Medicine

## 2013-07-04 ENCOUNTER — Encounter: Payer: Self-pay | Admitting: Internal Medicine

## 2013-07-04 ENCOUNTER — Other Ambulatory Visit (INDEPENDENT_AMBULATORY_CARE_PROVIDER_SITE_OTHER): Payer: Medicare Other

## 2013-07-04 VITALS — BP 140/64 | HR 40 | Temp 97.5°F | Ht 68.75 in | Wt 232.8 lb

## 2013-07-04 DIAGNOSIS — F068 Other specified mental disorders due to known physiological condition: Secondary | ICD-10-CM

## 2013-07-04 DIAGNOSIS — I10 Essential (primary) hypertension: Secondary | ICD-10-CM

## 2013-07-04 DIAGNOSIS — E669 Obesity, unspecified: Secondary | ICD-10-CM

## 2013-07-04 DIAGNOSIS — E785 Hyperlipidemia, unspecified: Secondary | ICD-10-CM

## 2013-07-04 LAB — LIPID PANEL
CHOL/HDL RATIO: 4
Cholesterol: 148 mg/dL (ref 0–200)
HDL: 36.1 mg/dL — ABNORMAL LOW (ref >39.00)
LDL CALC: 87 mg/dL (ref 0–99)
Triglycerides: 125 mg/dL (ref 0.0–149.0)
VLDL: 25 mg/dL (ref 0.0–40.0)

## 2013-07-04 NOTE — Assessment & Plan Note (Signed)
Wt Readings from Last 3 Encounters:  07/04/13 232 lb 12 oz (105.575 kg)  01/11/13 249 lb (112.946 kg)  07/11/12 242 lb (109.77 kg)   The patient is asked to make an attempt to improve diet and exercise patterns to aid in medical management of this problem.

## 2013-07-04 NOTE — Progress Notes (Signed)
Pre visit review using our clinic review tool, if applicable. No additional management support is needed unless otherwise documented below in the visit note. 

## 2013-07-04 NOTE — Patient Instructions (Signed)
It was good to see you today.  We have reviewed your prior records including labs and tests today  Test(s) ordered today. Your results will be released to MyChart (or called to you) after review, usually within 72hours after test completion. If any changes need to be made, you will be notified at that same time.  Medications reviewed and updated, no changes recommended at this time.  Please schedule followup in 6 months for annual check/labs, call sooner if problems.

## 2013-07-04 NOTE — Progress Notes (Signed)
Subjective:    Patient ID: Jacob Costa, male    DOB: 19-Aug-1932, 80 y.o.   MRN: [id number]  HPI  Patient here for follow up Reviewed chronic medical issues and interval medical events  Past Medical History  Diagnosis Date  . VITAMIN B12 DEFICIENCY 11/2009 dx  . CORONARY ATHEROSCLEROSIS NATIVE CORONARY ARTERY 02/2005    MI followed by CABGx3  . DYSLIPIDEMIA   . HYPERTENSION   . MYOCARDIAL INFARCTION 02/2005  . OBESITY   . DEMENTIA   . ISCHEMIC CARDIOMYOPATHY   . PERCUTANEOUS TRANSLUMINAL CORONARY ANGIOPLASTY, HX OF     Review of Systems  Constitutional: Negative for fever and fatigue.  Respiratory: Negative for cough and shortness of breath.   Cardiovascular: Negative for chest pain and leg swelling.  Psychiatric/Behavioral: Positive for confusion (stable). Negative for hallucinations, behavioral problems, self-injury, dysphoric mood and agitation. The patient is not nervous/anxious and is not hyperactive.        Objective:   Physical Exam  BP 140/64  Pulse 40  Temp(Src) 97.5 F (36.4 C) (Oral)  Ht 5' 8.75" (1.746 m)  Wt 232 lb 12 oz (105.575 kg)  BMI 34.63 kg/m2  SpO2 96% Wt Readings from Last 3 Encounters:  07/04/13 232 lb 12 oz (105.575 kg)  01/11/13 249 lb (112.946 kg)  07/11/12 242 lb (109.77 kg)   Constitutional:  He is overweight, appears well-developed and well-nourished. No distress. Wife at side. Cardiovascular: Mild brady rate, regular rhythm and normal heart sounds.  No murmur heard. no BLE edema Pulmonary/Chest: Effort normal and breath sounds normal. No respiratory distress. no wheezes.  Abdomen: protuberant, soft, nontender. No rebound or guarding. Bowel sounds present throughout. No mass Neurological: he is alert and oriented to person, place, and time. No cranial nerve deficit. Coordination normal. Good attention and recall. Psyc: pleasant mood and normal affect. Quiet and reserved but interactive when asked to participate.  Lab Results    Component Value Date   WBC 7.5 07/11/2012   HGB 13.6 07/11/2012   HCT 40.3 07/11/2012   PLT 179.0 07/11/2012   GLUCOSE 88 07/11/2012   CHOL 164 01/12/2012   TRIG 158.0* 01/12/2012   HDL 40.60 01/12/2012   LDLCALC 92 01/12/2012   ALT 11 07/11/2012   AST 20 07/11/2012   NA 140 07/11/2012   K 4.3 07/11/2012   CL 103 07/11/2012   CREATININE 1.2 07/11/2012   BUN 13 07/11/2012   CO2 29 07/11/2012   TSH 2.92 07/11/2012   INR 0.9 09/03/2008   HGBA1C 5.9 07/02/2007    Dg Ankle Complete Left  07/28/2011   *RADIOLOGY REPORT*  Clinical Data: Fracture fixation.  DG C-ARM 1-60 MIN  Comparison: Plain films left ankle 07/01/2011.  Findings: We are provided with four fluoroscopic spot views of the left ankle.  Images demonstrate lateral plate and screws in place for fixation of a lateral malleolar fracture.  Widening of the medial clear space has been reduced.  Position of the tibiotalar joint appears anatomic.  IMPRESSION: ORIF left ankle as above.  Original Report Authenticated By: Bernadene BellHOMAS L. D'ALESSIO, M.D.  Dg C-arm 1-60 Min  07/28/2011   *RADIOLOGY REPORT*  Clinical Data: Fracture fixation.  DG C-ARM 1-60 MIN  Comparison: Plain films left ankle 07/01/2011.  Findings: We are provided with four fluoroscopic spot views of the left ankle.  Images demonstrate lateral plate and screws in place for fixation of a lateral malleolar fracture.  Widening of the medial clear space has been reduced.  Position  of the tibiotalar joint appears anatomic.  IMPRESSION: ORIF left ankle as above.  Original Report Authenticated By: Bernadene BellHOMAS L. Maricela Curet'ALESSIO, M.D.      Assessment & Plan:   Problem List Items Addressed This Visit   DEMENTIA     Started on Aricept 11/2009; dose increase 02/2010 - also ongoing B12 replacement (oral) MRI brain 11/2009 unremarkable Added remeron 08/2010 for sleep and depression issues - much improved - mild weight gain and mild sedation side effects have stabilized No need for ativan prn irritable outbursts  since adding remeron  Slight sundowning and delusions experienced in setting of ankle fx/OR and hosp 06/2011 with SNF rehab now back to baseline, s/p smooth transition home The current medical regimen is effective;  continue present plan and medications.     DYSLIPIDEMIA - Primary     reduced to simva to 20mg  qhs in 09/2010 -  check lipids annually     Relevant Orders      Lipid panel   HYPERTENSION      note stable bradycardia on current Bbloc dose The current medical regimen is effective;  continue present plan and medications.  BP Readings from Last 3 Encounters:  07/04/13 140/64  01/11/13 132/70  07/11/12 112/62      Relevant Orders      Lipid panel   OBESITY      Wt Readings from Last 3 Encounters:  07/04/13 232 lb 12 oz (105.575 kg)  01/11/13 249 lb (112.946 kg)  07/11/12 242 lb (109.77 kg)   The patient is asked to make an attempt to improve diet and exercise patterns to aid in medical management of this problem.

## 2013-07-04 NOTE — Assessment & Plan Note (Signed)
Started on Aricept 11/2009; dose increase 02/2010 - also ongoing B12 replacement (oral) MRI brain 11/2009 unremarkable Added remeron 08/2010 for sleep and depression issues - much improved - mild weight gain and mild sedation side effects have stabilized No need for ativan prn irritable outbursts since adding remeron  Slight sundowning and delusions experienced in setting of ankle fx/OR and hosp 06/2011 with SNF rehab now back to baseline, s/p smooth transition home The current medical regimen is effective;  continue present plan and medications.

## 2013-07-04 NOTE — Assessment & Plan Note (Signed)
note stable bradycardia on current Bbloc dose The current medical regimen is effective;  continue present plan and medications.  BP Readings from Last 3 Encounters:  07/04/13 140/64  01/11/13 132/70  07/11/12 112/62

## 2013-07-04 NOTE — Assessment & Plan Note (Signed)
reduced to simva to 20mg  qhs in 09/2010 -  check lipids annually

## 2013-07-08 ENCOUNTER — Encounter: Payer: Self-pay | Admitting: Internal Medicine

## 2013-07-11 ENCOUNTER — Ambulatory Visit: Payer: Medicare Other | Admitting: Internal Medicine

## 2013-07-30 ENCOUNTER — Other Ambulatory Visit: Payer: Self-pay | Admitting: Internal Medicine

## 2013-07-31 NOTE — Telephone Encounter (Signed)
[facility].../[facility]mb 

## 2013-08-13 ENCOUNTER — Encounter: Payer: Self-pay | Admitting: Internal Medicine

## 2013-08-13 ENCOUNTER — Other Ambulatory Visit: Payer: Self-pay | Admitting: Internal Medicine

## 2013-08-13 MED ORDER — METOPROLOL SUCCINATE ER 50 MG PO TB24: 50.0000 mg | ORAL_TABLET | Freq: Every day | ORAL | Status: DC

## 2013-10-04 ENCOUNTER — Other Ambulatory Visit: Payer: Self-pay | Admitting: Internal Medicine

## 2013-10-08 NOTE — Telephone Encounter (Signed)
[facility].../[facility]mb 

## 2013-12-11 ENCOUNTER — Encounter: Payer: Self-pay | Admitting: Internal Medicine

## 2013-12-12 NOTE — Telephone Encounter (Signed)
Please schedule an [facility]pper [facility] sooner than later Thanks -and let pt wife martha know same of course ;)

## 2013-12-13 ENCOUNTER — Encounter: Payer: Self-pay | Admitting: Internal Medicine

## 2013-12-13 ENCOUNTER — Other Ambulatory Visit (INDEPENDENT_AMBULATORY_CARE_PROVIDER_SITE_OTHER): Payer: Medicare Other

## 2013-12-13 ENCOUNTER — Ambulatory Visit (INDEPENDENT_AMBULATORY_CARE_PROVIDER_SITE_OTHER): Payer: Medicare Other | Admitting: Internal Medicine

## 2013-12-13 VITALS — BP 150/86 | HR 94 | Temp 98.2°F | Resp 15 | Wt 226.0 lb

## 2013-12-13 DIAGNOSIS — E785 Hyperlipidemia, unspecified: Secondary | ICD-10-CM

## 2013-12-13 DIAGNOSIS — R5381 Other malaise: Secondary | ICD-10-CM

## 2013-12-13 DIAGNOSIS — F0391 Unspecified dementia with behavioral disturbance: Secondary | ICD-10-CM

## 2013-12-13 DIAGNOSIS — F03918 Unspecified dementia, unspecified severity, with other behavioral disturbance: Secondary | ICD-10-CM

## 2013-12-13 DIAGNOSIS — L299 Pruritus, unspecified: Secondary | ICD-10-CM

## 2013-12-13 DIAGNOSIS — E538 Deficiency of other specified B group vitamins: Secondary | ICD-10-CM

## 2013-12-13 DIAGNOSIS — I10 Essential (primary) hypertension: Secondary | ICD-10-CM

## 2013-12-13 DIAGNOSIS — I259 Chronic ischemic heart disease, unspecified: Secondary | ICD-10-CM

## 2013-12-13 LAB — CBC WITH DIFFERENTIAL/PLATELET
BASOS PCT: 0.7 % (ref 0.0–3.0)
Basophils Absolute: 0.1 10*3/uL (ref 0.0–0.1)
EOS PCT: 3.4 % (ref 0.0–5.0)
Eosinophils Absolute: 0.4 10*3/uL (ref 0.0–0.7)
HEMATOCRIT: 38.8 % — AB (ref 39.0–52.0)
HEMOGLOBIN: 12.6 g/dL — AB (ref 13.0–17.0)
LYMPHS ABS: 0.7 10*3/uL (ref 0.7–4.0)
LYMPHS PCT: 6.1 % — AB (ref 12.0–46.0)
MCHC: 32.3 g/dL (ref 30.0–36.0)
MCV: 83.8 fl (ref 78.0–100.0)
MONOS PCT: 7.3 % (ref 3.0–12.0)
Monocytes Absolute: 0.9 10*3/uL (ref 0.1–1.0)
NEUTROS ABS: 9.8 10*3/uL — AB (ref 1.4–7.7)
Neutrophils Relative %: 82.5 % — ABNORMAL HIGH (ref 43.0–77.0)
Platelets: 293 10*3/uL (ref 150.0–400.0)
RBC: 4.63 Mil/uL (ref 4.22–5.81)
RDW: 13.3 % (ref 11.5–15.5)
WBC: 11.9 10*3/uL — AB (ref 4.0–10.5)

## 2013-12-13 LAB — URINALYSIS, ROUTINE W REFLEX MICROSCOPIC
KETONES UR: 15 — AB
Leukocytes, UA: NEGATIVE
Nitrite: NEGATIVE
Specific Gravity, Urine: >=1.03 — AB (ref 1.000–1.030)
Total Protein, Urine: 30 — AB
URINE GLUCOSE: NEGATIVE
Urobilinogen, UA: 1 (ref 0.0–1.0)
pH: 5 (ref 5.0–8.0)

## 2013-12-13 LAB — BASIC METABOLIC PANEL
BUN: 10 mg/dL (ref 6–23)
CHLORIDE: 98 meq/L (ref 96–112)
CO2: 26 meq/L (ref 19–32)
CREATININE: 1.2 mg/dL (ref 0.4–1.5)
Calcium: 8.6 mg/dL (ref 8.4–10.5)
GFR: 63.32 mL/min (ref >60.00)
Glucose, Bld: 200 mg/dL — ABNORMAL HIGH (ref 70–99)
Potassium: 3.7 mEq/L (ref 3.5–5.1)
Sodium: 136 mEq/L (ref 135–145)

## 2013-12-13 LAB — HEPATIC FUNCTION PANEL
ALBUMIN: 2.8 g/dL — AB (ref 3.5–5.2)
ALT: 17 U/L (ref 0–53)
AST: 24 U/L (ref 0–37)
Alkaline Phosphatase: 96 U/L (ref 39–117)
Bilirubin, Direct: 0.2 mg/dL (ref 0.0–0.3)
Total Bilirubin: 0.9 mg/dL (ref 0.2–1.2)
Total Protein: 8.5 g/dL — ABNORMAL HIGH (ref 6.0–8.3)

## 2013-12-13 LAB — LIPID PANEL
CHOL/HDL RATIO: 6
Cholesterol: 179 mg/dL (ref 0–200)
HDL: 29.1 mg/dL — AB (ref >39.00)
LDL CALC: 132 mg/dL — AB (ref 0–99)
NonHDL: 149.9
TRIGLYCERIDES: 89 mg/dL (ref 0.0–149.0)
VLDL: 17.8 mg/dL (ref 0.0–40.0)

## 2013-12-13 LAB — TSH: TSH: 1.86 u[IU]/mL (ref 0.35–4.50)

## 2013-12-13 NOTE — Progress Notes (Signed)
Pre visit review using our clinic review tool, if applicable. No additional management support is needed unless otherwise documented below in the visit note. 

## 2013-12-13 NOTE — Progress Notes (Signed)
   Subjective:    Patient ID: Jacob Costa, male    DOB: [birth year], 80 y.o.   MRN: [id number]  HPI   His wife states that he has stayed cold for the last week. He has also exhibited anorexia and  generalized weakness to the point that ambulation is limited.  There are no definite localizing symptoms or signs but he does have severe dementia.  He has a history of B12 deficiency;he is on oral B12.   Review of Systems Frontal headache, facial pain , nasal purulence, dental pain, sore throat , otic pain or otic discharge denied. No fever , chills or sweats. Dysuria, pyuria, hematuria, frequency, nocturia or polyuria are denied. Extrinsic symptoms of itchy, watery eyes, sneezing, or angioedema are denied. There is no significant cough, sputum production, wheezing,or  paroxysmal nocturnal dyspnea. Unexplained weight loss, abdominal pain,or diarrhea are denied.     Objective:   Physical Exam   Positive or pertinent findings include: He appears disheveled and chronically ill. He has markedly decreased affect with almost no verbal interaction. His wife is the historian He is edentulous. Pedal pulses are decreased. He does have trace-one half edema He has severe scattered excoriations particularly of the abdomen and extremities, some with bleeding. There are no excoriations over the posterior thorax area he cannot reach. He has dried blood and debris under his nails  Eyes: No conjunctival inflammation or scleral icterus is present. Oral exam: Lips and gums are healthy appearing.There is no oropharyngeal erythema or exudate noted.  Heart:  Normal rate and regular rhythm. S1 and S2 normal without gallop, murmur, click, rub or other extra sounds   Lungs:Chest clear to auscultation; no wheezes, rhonchi,rales ,or rubs present.No increased work of breathing.  Abdomen: bowel sounds normal, soft and non-tender without masses, organomegaly or hernias noted.  No guarding or rebound. No flank  tenderness to percussion. Vascular : all pulses equal ; no bruits present. Skin:Warm & dry ; no jaundice or tenting Lymphatic: No lymphadenopathy is noted about the head, neck, axilla         Assessment & Plan:  #1 malaise  #2 itching  #3 B12 deficiency  #4 dementia  Plan: See orders & recommendations

## 2013-12-13 NOTE — Assessment & Plan Note (Signed)
CBC Methylmalonic acid level

## 2013-12-13 NOTE — Patient Instructions (Signed)
Your next office appointment will be determined based upon review of your pending labs. Those instructions will be transmitted to you through My Chart  OR  by mail;whichever process is your choice to receive results & recommendations . Followup as needed for your acute issue. Please report any significant change in your symptoms. Hold the simvastatin until labs reported to you.

## 2013-12-13 NOTE — Assessment & Plan Note (Signed)
Neurology referral 

## 2013-12-15 ENCOUNTER — Other Ambulatory Visit: Payer: Self-pay | Admitting: Internal Medicine

## 2013-12-15 DIAGNOSIS — R3129 Other microscopic hematuria: Secondary | ICD-10-CM

## 2013-12-15 LAB — URINE CULTURE
Colony Count: NO GROWTH
Organism ID, Bacteria: NO GROWTH

## 2013-12-16 ENCOUNTER — Other Ambulatory Visit (INDEPENDENT_AMBULATORY_CARE_PROVIDER_SITE_OTHER): Payer: Medicare Other

## 2013-12-16 ENCOUNTER — Telehealth: Payer: Self-pay | Admitting: Internal Medicine

## 2013-12-16 ENCOUNTER — Telehealth: Payer: Self-pay

## 2013-12-16 DIAGNOSIS — R7309 Other abnormal glucose: Secondary | ICD-10-CM

## 2013-12-16 LAB — HEMOGLOBIN A1C: HEMOGLOBIN A1C: 6 % (ref 4.6–6.5)

## 2013-12-16 NOTE — Telephone Encounter (Signed)
emmi emailed °

## 2013-12-16 NOTE — Telephone Encounter (Signed)
Message copied by Noreene LarssonANDREWS, Dempsy Damiano R on Mon Dec 16, 2013  9:01 AM ------      Message from: Pecola LawlessHOPPER, WILLIAM F      Created: Sun Dec 15, 2013  9:11 AM       Please add A1c  ------

## 2013-12-16 NOTE — Telephone Encounter (Signed)
Request for add on has been faxed to lab 

## 2013-12-17 LAB — METHYLMALONIC ACID, SERUM: Methylmalonic Acid, Quant: 142 nmol/L (ref 87–318)

## 2013-12-18 ENCOUNTER — Emergency Department (HOSPITAL_COMMUNITY): Payer: Medicare Other

## 2013-12-18 ENCOUNTER — Inpatient Hospital Stay (HOSPITAL_COMMUNITY)
Admission: EM | Admit: 2013-12-18 | Discharge: 2013-12-23 | DRG: 603 | Disposition: A | Discharge status: Skilled Nursing Facility | Payer: Medicare Other | Attending: Internal Medicine | Admitting: Internal Medicine

## 2013-12-18 ENCOUNTER — Encounter (HOSPITAL_COMMUNITY): Payer: Self-pay | Admitting: Emergency Medicine

## 2013-12-18 ENCOUNTER — Ambulatory Visit: Payer: Medicare Other | Admitting: Neurology

## 2013-12-18 ENCOUNTER — Telehealth: Payer: Self-pay | Admitting: Neurology

## 2013-12-18 DIAGNOSIS — I255 Ischemic cardiomyopathy: Secondary | ICD-10-CM | POA: Diagnosis present

## 2013-12-18 DIAGNOSIS — Z8249 Family history of ischemic heart disease and other diseases of the circulatory system: Secondary | ICD-10-CM

## 2013-12-18 DIAGNOSIS — R3129 Other microscopic hematuria: Secondary | ICD-10-CM

## 2013-12-18 DIAGNOSIS — Z951 Presence of aortocoronary bypass graft: Secondary | ICD-10-CM

## 2013-12-18 DIAGNOSIS — E785 Hyperlipidemia, unspecified: Secondary | ICD-10-CM | POA: Diagnosis not present

## 2013-12-18 DIAGNOSIS — G309 Alzheimer's disease, unspecified: Secondary | ICD-10-CM | POA: Diagnosis not present

## 2013-12-18 DIAGNOSIS — Z7982 Long term (current) use of aspirin: Secondary | ICD-10-CM | POA: Diagnosis not present

## 2013-12-18 DIAGNOSIS — F0391 Unspecified dementia with behavioral disturbance: Secondary | ICD-10-CM

## 2013-12-18 DIAGNOSIS — Z23 Encounter for immunization: Secondary | ICD-10-CM

## 2013-12-18 DIAGNOSIS — Z66 Do not resuscitate: Secondary | ICD-10-CM | POA: Diagnosis not present

## 2013-12-18 DIAGNOSIS — L0390 Cellulitis, unspecified: Secondary | ICD-10-CM | POA: Diagnosis not present

## 2013-12-18 DIAGNOSIS — L981 Factitial dermatitis: Secondary | ICD-10-CM | POA: Diagnosis not present

## 2013-12-18 DIAGNOSIS — Z515 Encounter for palliative care: Secondary | ICD-10-CM | POA: Diagnosis not present

## 2013-12-18 DIAGNOSIS — I2510 Atherosclerotic heart disease of native coronary artery without angina pectoris: Secondary | ICD-10-CM | POA: Diagnosis not present

## 2013-12-18 DIAGNOSIS — Z79899 Other long term (current) drug therapy: Secondary | ICD-10-CM

## 2013-12-18 DIAGNOSIS — L0100 Impetigo, unspecified: Principal | ICD-10-CM | POA: Diagnosis present

## 2013-12-18 DIAGNOSIS — W19XXXA Unspecified fall, initial encounter: Secondary | ICD-10-CM | POA: Diagnosis not present

## 2013-12-18 DIAGNOSIS — Z823 Family history of stroke: Secondary | ICD-10-CM | POA: Diagnosis not present

## 2013-12-18 DIAGNOSIS — I252 Old myocardial infarction: Secondary | ICD-10-CM

## 2013-12-18 DIAGNOSIS — L299 Pruritus, unspecified: Secondary | ICD-10-CM | POA: Diagnosis present

## 2013-12-18 DIAGNOSIS — Z9861 Coronary angioplasty status: Secondary | ICD-10-CM | POA: Diagnosis not present

## 2013-12-18 DIAGNOSIS — Y92003 Bedroom of unspecified non-institutional (private) residence as the place of occurrence of the external cause: Secondary | ICD-10-CM | POA: Diagnosis not present

## 2013-12-18 DIAGNOSIS — F0390 Unspecified dementia without behavioral disturbance: Secondary | ICD-10-CM

## 2013-12-18 DIAGNOSIS — B9561 Methicillin susceptible Staphylococcus aureus infection as the cause of diseases classified elsewhere: Secondary | ICD-10-CM | POA: Diagnosis present

## 2013-12-18 DIAGNOSIS — Y92009 Unspecified place in unspecified non-institutional (private) residence as the place of occurrence of the external cause: Secondary | ICD-10-CM

## 2013-12-18 DIAGNOSIS — F03918 Unspecified dementia, unspecified severity, with other behavioral disturbance: Secondary | ICD-10-CM | POA: Diagnosis present

## 2013-12-18 DIAGNOSIS — D72829 Elevated white blood cell count, unspecified: Secondary | ICD-10-CM | POA: Diagnosis present

## 2013-12-18 DIAGNOSIS — L308 Other specified dermatitis: Secondary | ICD-10-CM | POA: Diagnosis present

## 2013-12-18 DIAGNOSIS — E538 Deficiency of other specified B group vitamins: Secondary | ICD-10-CM | POA: Diagnosis not present

## 2013-12-18 DIAGNOSIS — I10 Essential (primary) hypertension: Secondary | ICD-10-CM | POA: Diagnosis not present

## 2013-12-18 DIAGNOSIS — F0281 Dementia in other diseases classified elsewhere with behavioral disturbance: Secondary | ICD-10-CM | POA: Diagnosis not present

## 2013-12-18 HISTORY — DX: Major depressive disorder, single episode, unspecified: F32.9

## 2013-12-18 HISTORY — DX: Depression, unspecified: F32.A

## 2013-12-18 HISTORY — DX: Unspecified dementia, unspecified severity, without behavioral disturbance, psychotic disturbance, mood disturbance, and anxiety: F03.90

## 2013-12-18 LAB — URINALYSIS, ROUTINE W REFLEX MICROSCOPIC
Glucose, UA: NEGATIVE mg/dL
Ketones, ur: 40 mg/dL — AB
Leukocytes, UA: NEGATIVE
Nitrite: NEGATIVE
Protein, ur: NEGATIVE mg/dL
Specific Gravity, Urine: 1.03 (ref 1.005–1.030)
Urobilinogen, UA: 1 mg/dL (ref 0.0–1.0)
pH: 5 (ref 5.0–8.0)

## 2013-12-18 LAB — BASIC METABOLIC PANEL WITH GFR
Anion gap: 17 — ABNORMAL HIGH (ref 5–15)
BUN: 12 mg/dL (ref 6–23)
CO2: 26 meq/L (ref 19–32)
Calcium: 8.9 mg/dL (ref 8.4–10.5)
Chloride: 96 meq/L (ref 96–112)
Creatinine, Ser: 0.99 mg/dL (ref 0.50–1.35)
GFR calc Af Amer: 89 mL/min — ABNORMAL LOW
GFR calc non Af Amer: 76 mL/min — ABNORMAL LOW
Glucose, Bld: 109 mg/dL — ABNORMAL HIGH (ref 70–99)
Potassium: 4.2 meq/L (ref 3.7–5.3)
Sodium: 139 meq/L (ref 137–147)

## 2013-12-18 LAB — CBC WITH DIFFERENTIAL/PLATELET
Basophils Absolute: 0 10*3/uL (ref 0.0–0.1)
Basophils Relative: 0 % (ref 0–1)
Eosinophils Absolute: 0.6 10*3/uL (ref 0.0–0.7)
Eosinophils Relative: 5 % (ref 0–5)
HCT: 39.8 % (ref 39.0–52.0)
Hemoglobin: 12.8 g/dL — ABNORMAL LOW (ref 13.0–17.0)
Lymphocytes Relative: 7 % — ABNORMAL LOW (ref 12–46)
Lymphs Abs: 0.9 10*3/uL (ref 0.7–4.0)
MCH: 27.8 pg (ref 26.0–34.0)
MCHC: 32.2 g/dL (ref 30.0–36.0)
MCV: 86.5 fL (ref 78.0–100.0)
Monocytes Absolute: 1.2 10*3/uL — ABNORMAL HIGH (ref 0.1–1.0)
Monocytes Relative: 10 % (ref 3–12)
Neutro Abs: 9.8 10*3/uL — ABNORMAL HIGH (ref 1.7–7.7)
Neutrophils Relative %: 78 % — ABNORMAL HIGH (ref 43–77)
Platelets: 288 10*3/uL (ref 150–400)
RBC: 4.6 MIL/uL (ref 4.22–5.81)
RDW: 13 % (ref 11.5–15.5)
WBC: 12.6 10*3/uL — ABNORMAL HIGH (ref 4.0–10.5)

## 2013-12-18 LAB — I-STAT TROPONIN, ED: Troponin i, poc: 0.02 ng/mL (ref 0.00–0.08)

## 2013-12-18 LAB — URINE MICROSCOPIC-ADD ON

## 2013-12-18 LAB — PROTIME-INR
INR: 1.06 (ref 0.00–1.49)
Prothrombin Time: 13.9 s (ref 11.6–15.2)

## 2013-12-18 LAB — HEPATIC FUNCTION PANEL
ALT: 15 U/L (ref 0–53)
AST: 22 U/L (ref 0–37)
Albumin: 2.9 g/dL — ABNORMAL LOW (ref 3.5–5.2)
Alkaline Phosphatase: 103 U/L (ref 39–117)
Bilirubin, Direct: <0.2 mg/dL (ref 0.0–0.3)
Total Bilirubin: 0.6 mg/dL (ref 0.3–1.2)
Total Protein: 8.6 g/dL — ABNORMAL HIGH (ref 6.0–8.3)

## 2013-12-18 LAB — I-STAT CG4 LACTIC ACID, ED: Lactic Acid, Venous: 1.46 mmol/L (ref 0.5–2.2)

## 2013-12-18 MED ORDER — ONDANSETRON HCL 4 MG PO TABS: 4.0000 mg | ORAL_TABLET | Freq: Four times a day (QID) | ORAL | Status: DC | PRN

## 2013-12-18 MED ORDER — SODIUM CHLORIDE 0.9 % IV SOLN
Freq: Once | INTRAVENOUS | Status: AC
  Administered 2013-12-18: 12:00:00 via INTRAVENOUS

## 2013-12-18 MED ORDER — VITAMIN B-12 1000 MCG PO TABS
1000.0000 ug | ORAL_TABLET | Freq: Every day | ORAL | Status: DC
  Administered 2013-12-18 – 2013-12-23 (×6): 1000 ug via ORAL
  Filled 2013-12-18 (×6): qty 1

## 2013-12-18 MED ORDER — ACETAMINOPHEN 325 MG PO TABS: 650.0000 mg | ORAL_TABLET | Freq: Four times a day (QID) | ORAL | Status: DC | PRN

## 2013-12-18 MED ORDER — ASPIRIN EC 81 MG PO TBEC
81.0000 mg | DELAYED_RELEASE_TABLET | Freq: Every day | ORAL | Status: DC
  Administered 2013-12-18 – 2013-12-23 (×6): 81 mg via ORAL
  Filled 2013-12-18 (×6): qty 1

## 2013-12-18 MED ORDER — LORAZEPAM 0.5 MG PO TABS
0.5000 mg | ORAL_TABLET | Freq: Three times a day (TID) | ORAL | Status: DC | PRN
  Administered 2013-12-21: 0.5 mg via ORAL
  Filled 2013-12-18: qty 1

## 2013-12-18 MED ORDER — TETANUS-DIPHTH-ACELL PERTUSSIS 5-2.5-18.5 LF-MCG/0.5 IM SUSP
0.5000 mL | Freq: Once | INTRAMUSCULAR | Status: AC
  Administered 2013-12-18: 0.5 mL via INTRAMUSCULAR
  Filled 2013-12-18: qty 0.5

## 2013-12-18 MED ORDER — CHLORHEXIDINE GLUCONATE 4 % EX LIQD
Freq: Every day | CUTANEOUS | Status: DC
  Administered 2013-12-18 – 2013-12-20 (×3): via TOPICAL
  Filled 2013-12-18: qty 15

## 2013-12-18 MED ORDER — SODIUM CHLORIDE 0.9 % IJ SOLN: 3.0000 mL | INTRAMUSCULAR | Status: DC | PRN

## 2013-12-18 MED ORDER — SODIUM CHLORIDE 0.9 % IV SOLN: 250.0000 mL | INTRAVENOUS | Status: DC | PRN

## 2013-12-18 MED ORDER — DONEPEZIL HCL 10 MG PO TABS
10.0000 mg | ORAL_TABLET | Freq: Every day | ORAL | Status: DC
  Filled 2013-12-18: qty 1

## 2013-12-18 MED ORDER — DIPHENHYDRAMINE HCL 50 MG/ML IJ SOLN
12.5000 mg | Freq: Four times a day (QID) | INTRAMUSCULAR | Status: DC | PRN
  Administered 2013-12-18: 12.5 mg via INTRAVENOUS
  Filled 2013-12-18: qty 1

## 2013-12-18 MED ORDER — VANCOMYCIN HCL IN DEXTROSE 1-5 GM/200ML-% IV SOLN
1000.0000 mg | Freq: Once | INTRAVENOUS | Status: AC
  Administered 2013-12-18: 1000 mg via INTRAVENOUS
  Filled 2013-12-18: qty 200

## 2013-12-18 MED ORDER — ACETAMINOPHEN 650 MG RE SUPP: 650.0000 mg | Freq: Four times a day (QID) | RECTAL | Status: DC | PRN

## 2013-12-18 MED ORDER — SODIUM CHLORIDE 0.9 % IV BOLUS (SEPSIS)
1000.0000 mL | Freq: Once | INTRAVENOUS | Status: AC
  Administered 2013-12-18: 1000 mL via INTRAVENOUS

## 2013-12-18 MED ORDER — METOPROLOL SUCCINATE ER 50 MG PO TB24
50.0000 mg | ORAL_TABLET | Freq: Every day | ORAL | Status: DC
  Administered 2013-12-18 – 2013-12-23 (×6): 50 mg via ORAL
  Filled 2013-12-18 (×6): qty 1

## 2013-12-18 MED ORDER — SODIUM CHLORIDE 0.9 % IJ SOLN
3.0000 mL | Freq: Two times a day (BID) | INTRAMUSCULAR | Status: DC
  Administered 2013-12-18 – 2013-12-23 (×7): 3 mL via INTRAVENOUS

## 2013-12-18 MED ORDER — ASPIRIN 81 MG PO TABS: 81.0000 mg | ORAL_TABLET | Freq: Every day | ORAL | Status: DC

## 2013-12-18 MED ORDER — SIMVASTATIN 20 MG PO TABS
20.0000 mg | ORAL_TABLET | Freq: Every day | ORAL | Status: DC
  Filled 2013-12-18: qty 1

## 2013-12-18 MED ORDER — MIRTAZAPINE 7.5 MG PO TABS
7.5000 mg | ORAL_TABLET | Freq: Every day | ORAL | Status: DC
  Administered 2013-12-18 – 2013-12-22 (×4): 7.5 mg via ORAL
  Filled 2013-12-18 (×6): qty 1

## 2013-12-18 MED ORDER — SODIUM CHLORIDE 0.9 % IV SOLN: INTRAVENOUS | Status: DC

## 2013-12-18 MED ORDER — ENOXAPARIN SODIUM 40 MG/0.4ML ~~LOC~~ SOLN
40.0000 mg | Freq: Every day | SUBCUTANEOUS | Status: DC
  Administered 2013-12-19 – 2013-12-23 (×5): 40 mg via SUBCUTANEOUS
  Filled 2013-12-18 (×5): qty 0.4

## 2013-12-18 MED ORDER — RAMIPRIL 2.5 MG PO CAPS
2.5000 mg | ORAL_CAPSULE | Freq: Every day | ORAL | Status: DC
  Filled 2013-12-18: qty 1

## 2013-12-18 MED ORDER — ONDANSETRON HCL 4 MG/2ML IJ SOLN: 4.0000 mg | Freq: Four times a day (QID) | INTRAMUSCULAR | Status: DC | PRN
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
## 2013-12-18 NOTE — ED Notes (Signed)
Attempted to stand patient up to use urinal. Unable to obtain sample. Will catheterize.

## 2013-12-18 NOTE — Telephone Encounter (Signed)
Pt called at 7:50AM on 12/18/13 and spoke w/ Glade NurseAndy foltz from [facility]. Pt had to cancel his NP appt due to him being at [place]. Dr. Alwyn RenHopper, referring provider was notified.   C/B [phone]

## 2013-12-18 NOTE — Progress Notes (Signed)
Attempt to call report made. 

## 2013-12-18 NOTE — Progress Notes (Addendum)
Jacob Costa [id number] Admission Data: 12/18/2013 5:05 PM Attending Provider: Joseph ArtJessica U Vann, DO  WUJ:[id number]:Valerie Felicity CoyerLeschber, MD Consults/ Treatment Team: Treatment Team:  [facility]  Jacob Costa is a 80 y.o. male patient admitted from ED awake, alert  & orientated  X 2,  Full Code, VSS - Blood pressure 142/76, pulse 96, temperature 97.3 F (36.3 C), temperature source Oral, resp. rate 16, height 6' (1.829 m), weight 97.796 kg (215 lb 9.6 oz), SpO2 96.00%. no c/o shortness of breath, no c/o chest pain, no distress noted.    IV site WDL:  Right A/C NSL.   Allergies:  No Known Allergies   Past Medical History  Diagnosis Date  . VITAMIN B12 DEFICIENCY 11/2009 dx  . CORONARY ATHEROSCLEROSIS NATIVE CORONARY ARTERY 02/2005    MI followed by CABGx3  . DYSLIPIDEMIA   . HYPERTENSION   . OBESITY   . ISCHEMIC CARDIOMYOPATHY   . MYOCARDIAL INFARCTION 02/2005  . Depression   . Dementia dx'd ~ 2009    "not Alzheimer's"      Pt orientation to unit, room and routine. Information packet given to patient/family and safety video watched.  Admission INP armband ID verified with patient/family, and in place. SR up x 2, fall risk assessment complete with Patient and family verbalizing understanding of risks associated with falls. Pt verbalizes an understanding of how to use the call bell and to call for help before getting out of bed. Patient placed on contact, multiple wounds noted across body, rash to backside. Abdomin, large skin tear noted. Skin tear to abdomin noted from  fall prior to admission. Skin tear covered with vaseline gauze and foam.     Will cont to monitor and assist as needed.  Kern ReapBrumagin, Naod Sweetland L, RN 12/18/2013 5:05 PM

## 2013-12-18 NOTE — ED Notes (Signed)
Pt arrives via EMS from home with c/o fall, pt on aspirin - family wanted him checked out. Pt actively picking at skin. No LOC. Skin tear on abdomen, pt states he hit belly on dresser. CBG 134. Pt covered in sores and blisters.
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
## 2013-12-18 NOTE — Progress Notes (Signed)
  CARE MANAGEMENT ED NOTE 12/18/2013  Patient:  Jacob Costa,Jacob Costa   Account Number:  [id number]  Date Initiated:  12/18/2013  Documentation initiated by:  Ferdinand CavaSCHETTINO,Jerik Falletta  Subjective/Objective Assessment:   80 yo male presenting to the ED after a fall at home     Subjective/Objective Assessment Detail:     Action/Plan:   Patient spouse will discuss with inpatient CM and SW available options of HH, hospice, or SNF, upon discharge   Action/Plan Detail:   Anticipated DC Date:       Status Recommendation to Physician:   Result of Recommendation:  Agreed    DC Planning Services  CM consult  Other    Choice offered to / List presented to:  C-3 Spouse          Status of service:  Completed, signed off  ED Comments:   ED Comments Detail:  CM consulted to assist with appropriate resources in the home. This Cm spoke with the patient spouse, Jacob Costa, and she stated that the patient has been sleeping all day, minimal intake, and getting weaker. She stated that the PCP has explained that this is the progressive nature of dementia. This CM [facility] and what [facility] offer, to which she was familiar as the patient [facility] and [facility]. Jacob Costa stated that the PCP has also discussed hospice care. This CM explained further what hospice care entails and answered her questions. Jacob Costa also requested information regarding facility placement. She stated that she had been looking at ALF's but the patient has declined so rapidly in 2 weeks that she feels he now needs skilled level of care. She stated that she is uncertain of navigating the system for placement. This CM explained that a SW could provide information on the process and, if able, can assist with initiation of the process. This CM spoke with ED PA, Irving BurtonEmily, and was informed that the patient will be admitted so CM and SW consult ordered. Patient spouse aware of plan and  stated that this gives her time to think about all the different options for support upon discharge. No further ED CM needs identified at this time.

## 2013-12-19 DIAGNOSIS — I252 Old myocardial infarction: Secondary | ICD-10-CM | POA: Diagnosis not present

## 2013-12-19 DIAGNOSIS — Z515 Encounter for palliative care: Secondary | ICD-10-CM | POA: Diagnosis not present

## 2013-12-19 DIAGNOSIS — I10 Essential (primary) hypertension: Secondary | ICD-10-CM | POA: Diagnosis present

## 2013-12-19 DIAGNOSIS — Z823 Family history of stroke: Secondary | ICD-10-CM | POA: Diagnosis not present

## 2013-12-19 DIAGNOSIS — L981 Factitial dermatitis: Secondary | ICD-10-CM | POA: Diagnosis present

## 2013-12-19 DIAGNOSIS — Z66 Do not resuscitate: Secondary | ICD-10-CM | POA: Diagnosis present

## 2013-12-19 DIAGNOSIS — E538 Deficiency of other specified B group vitamins: Secondary | ICD-10-CM | POA: Diagnosis present

## 2013-12-19 DIAGNOSIS — L308 Other specified dermatitis: Secondary | ICD-10-CM | POA: Diagnosis present

## 2013-12-19 DIAGNOSIS — L0100 Impetigo, unspecified: Secondary | ICD-10-CM | POA: Diagnosis present

## 2013-12-19 DIAGNOSIS — Z7982 Long term (current) use of aspirin: Secondary | ICD-10-CM | POA: Diagnosis not present

## 2013-12-19 DIAGNOSIS — D72829 Elevated white blood cell count, unspecified: Secondary | ICD-10-CM | POA: Diagnosis present

## 2013-12-19 DIAGNOSIS — I255 Ischemic cardiomyopathy: Secondary | ICD-10-CM | POA: Diagnosis present

## 2013-12-19 DIAGNOSIS — E785 Hyperlipidemia, unspecified: Secondary | ICD-10-CM | POA: Diagnosis present

## 2013-12-19 DIAGNOSIS — Z79899 Other long term (current) drug therapy: Secondary | ICD-10-CM | POA: Diagnosis not present

## 2013-12-19 DIAGNOSIS — Z9861 Coronary angioplasty status: Secondary | ICD-10-CM | POA: Diagnosis not present

## 2013-12-19 DIAGNOSIS — Y92009 Unspecified place in unspecified non-institutional (private) residence as the place of occurrence of the external cause: Secondary | ICD-10-CM

## 2013-12-19 DIAGNOSIS — G309 Alzheimer's disease, unspecified: Secondary | ICD-10-CM | POA: Diagnosis present

## 2013-12-19 DIAGNOSIS — W19XXXA Unspecified fall, initial encounter: Secondary | ICD-10-CM

## 2013-12-19 DIAGNOSIS — L0390 Cellulitis, unspecified: Secondary | ICD-10-CM | POA: Diagnosis present

## 2013-12-19 DIAGNOSIS — L299 Pruritus, unspecified: Secondary | ICD-10-CM | POA: Diagnosis present

## 2013-12-19 DIAGNOSIS — Y92003 Bedroom of unspecified non-institutional (private) residence as the place of occurrence of the external cause: Secondary | ICD-10-CM | POA: Diagnosis not present

## 2013-12-19 DIAGNOSIS — I2510 Atherosclerotic heart disease of native coronary artery without angina pectoris: Secondary | ICD-10-CM | POA: Diagnosis present

## 2013-12-19 DIAGNOSIS — Z951 Presence of aortocoronary bypass graft: Secondary | ICD-10-CM | POA: Diagnosis not present

## 2013-12-19 DIAGNOSIS — Z8249 Family history of ischemic heart disease and other diseases of the circulatory system: Secondary | ICD-10-CM | POA: Diagnosis not present

## 2013-12-19 DIAGNOSIS — F0281 Dementia in other diseases classified elsewhere with behavioral disturbance: Secondary | ICD-10-CM | POA: Diagnosis present

## 2013-12-19 DIAGNOSIS — B9561 Methicillin susceptible Staphylococcus aureus infection as the cause of diseases classified elsewhere: Secondary | ICD-10-CM | POA: Diagnosis present

## 2013-12-19 DIAGNOSIS — Z23 Encounter for immunization: Secondary | ICD-10-CM | POA: Diagnosis not present

## 2013-12-19 LAB — URINE CULTURE
Colony Count: NO GROWTH
Culture: NO GROWTH

## 2013-12-19 LAB — BASIC METABOLIC PANEL
Anion gap: 13 (ref 5–15)
BUN: 11 mg/dL (ref 6–23)
CALCIUM: 8.3 mg/dL — AB (ref 8.4–10.5)
CO2: 27 meq/L (ref 19–32)
CREATININE: 0.94 mg/dL (ref 0.50–1.35)
Chloride: 97 mEq/L (ref 96–112)
GFR calc Af Amer: >90 mL/min (ref >90)
GFR calc non Af Amer: 78 mL/min — ABNORMAL LOW (ref >90)
GLUCOSE: 96 mg/dL (ref 70–99)
Potassium: 3.9 mEq/L (ref 3.7–5.3)
Sodium: 137 mEq/L (ref 137–147)

## 2013-12-19 LAB — CBC
HEMATOCRIT: 36.4 % — AB (ref 39.0–52.0)
Hemoglobin: 11.4 g/dL — ABNORMAL LOW (ref 13.0–17.0)
MCH: 26.5 pg (ref 26.0–34.0)
MCHC: 31.3 g/dL (ref 30.0–36.0)
MCV: 84.5 fL (ref 78.0–100.0)
Platelets: 267 10*3/uL (ref 150–400)
RBC: 4.31 MIL/uL (ref 4.22–5.81)
RDW: 12.9 % (ref 11.5–15.5)
WBC: 9.8 10*3/uL (ref 4.0–10.5)

## 2013-12-19 MED ORDER — ENSURE COMPLETE PO LIQD: 237.0000 mL | ORAL | Status: DC | PRN

## 2013-12-19 MED ORDER — CLINDAMYCIN HCL 300 MG PO CAPS
300.0000 mg | ORAL_CAPSULE | Freq: Three times a day (TID) | ORAL | Status: DC
  Filled 2013-12-19 (×3): qty 1

## 2013-12-19 MED ORDER — CLINDAMYCIN PHOSPHATE 300 MG/50ML IV SOLN
300.0000 mg | Freq: Four times a day (QID) | INTRAVENOUS | Status: DC
  Administered 2013-12-19 – 2013-12-20 (×6): 300 mg via INTRAVENOUS
  Filled 2013-12-19 (×10): qty 50

## 2013-12-19 MED ORDER — RISAQUAD PO CAPS
1.0000 | ORAL_CAPSULE | Freq: Every day | ORAL | Status: DC
  Administered 2013-12-19 – 2013-12-23 (×5): 1 via ORAL
  Filled 2013-12-19 (×5): qty 1

## 2013-12-19 MED ORDER — SODIUM CHLORIDE 0.9 % IV SOLN
INTRAVENOUS | Status: DC
  Administered 2013-12-19 – 2013-12-20 (×2): via INTRAVENOUS
  Administered 2013-12-20: 75 mL/h via INTRAVENOUS

## 2013-12-19 MED ORDER — CAMPHOR-MENTHOL 0.5-0.5 % EX LOTN
TOPICAL_LOTION | CUTANEOUS | Status: DC | PRN
  Administered 2013-12-20 – 2013-12-21 (×2): 1 via TOPICAL
  Filled 2013-12-19: qty 222

## 2013-12-19 MED ORDER — DIPHENHYDRAMINE HCL 25 MG PO CAPS
25.0000 mg | ORAL_CAPSULE | Freq: Four times a day (QID) | ORAL | Status: DC | PRN
  Administered 2013-12-21 – 2013-12-22 (×3): 25 mg via ORAL
  Filled 2013-12-19 (×3): qty 1

## 2013-12-19 NOTE — Progress Notes (Signed)
OT Cancellation Note  Patient Details Name: Jacob Costa MRN: [id number] DOB: 05/05/1932   Cancelled Treatment:    Reason Eval/Treat Not Completed: Other (comment). OT spoke with CM, who reports that family has chosen for pt to d/c to SNF with palliative care. Pt is Medicare and given D/C plan is SNF, and no apparent immediate acute care OT needs, therefore will defer OT to SNF. If OT eval is needed please call Acute Rehab Dept. at [phone] or text page OT at [phone].    Nena JordanMiller, Kaena Santori M  Carney LivingLeeAnn Marie Anadalay Macdonell, OTR/L Occupational Therapist [phone] (pager)  12/19/2013, 2:33 PM

## 2013-12-19 NOTE — Progress Notes (Signed)
UR completed 

## 2013-12-19 NOTE — Evaluation (Signed)
Physical Therapy Evaluation Patient Details Name: Jacob Costa MRN: [id number] DOB: [birth year] Today's Date: 12/19/2013   History of Present Illness  Patient is a 80 y/o male admitted s/p fall at home with recent progressive decline over the last 2 weeks- decreased PO, sleeping a lot, and getting weaker per wife report. Pt covered in sores and blisters. PMH of HTN, MI. dementia, depression, ischemic cardiomyopathy and CABG x3.   Clinical Impression  Patient presents with functional limitations due to deficits listed in PT problem list (see below). Pt with impaired cognition due to worsening dementia limiting safety awareness/judgment and mobility. Pt high falls risk. Pt became agitated for period of time during evaluation and not consistently following commands. For safety reasons and unpredictability of pt, recommend 2 person assist for safety. Wife is not sure she can care for patient at home at this time. Not sure pt would benefit from skilled therapy pending cognition however will follow up with acute PT while in hospital to assess appropriateness for therapy, improve safety/mobility and make discharge recommendations if disposition is home.    Follow Up Recommendations Other (comment);SNF;Supervision/Assistance - 24 hour (TBD pending wife's decision whether to take patient home or not.)    Equipment Recommendations  None recommended by PT    Recommendations for Other Services OT consult     Precautions / Restrictions Precautions Precautions: Fall Restrictions Weight Bearing Restrictions: No      Mobility  Bed Mobility Overal bed mobility: Needs Assistance Bed Mobility: Supine to Sit     Supine to sit: HOB elevated;Mod assist;+2 for safety/equipment     General bed mobility comments: Increased time to perform transfer due to decreased initiation/motivation to sit up; grabs therapist to assist with transfer. Mod A to elevate trunk and to scoot bottom to EOB. +2 for safety  as pt unpredictable with behavior.  Transfers Overall transfer level: Needs assistance Equipment used: None Transfers: Sit to/from Stand Sit to Stand: Min assist         General transfer comment: Min A to rise from EOB and for steadying upon standing. Sway noted with pt grabbing onto counter for support.   Ambulation/Gait Ambulation/Gait assistance: Min assist Ambulation Distance (Feet): 18 Feet Assistive device: None Gait Pattern/deviations: Step-through pattern;Decreased stride length;Staggering left;Staggering right;Wide base of support Gait velocity: slow   General Gait Details: Unsteady gait with staggering to right/left and a few almost LOB. Not following commands well, poor safety awareness/knowledge of deficits.   Stairs            Wheelchair Mobility    Modified Rankin (Stroke Patients Only)       Balance Overall balance assessment: Needs assistance Sitting-balance support: Feet supported Sitting balance-Leahy Scale: Fair Sitting balance - Comments: Difficult to assess due to cognitive deficits and decreased ability to follow commands. Able to sit unsupported.   Standing balance support: During functional activity Standing balance-Leahy Scale: Fair Standing balance comment: Tolerated short distance ambulation today however very unsteady with a few LOB requiring Min A to prevent fall.                              Pertinent Vitals/Pain Pain Assessment: No/denies pain    Home Living Family/patient expects to be discharged to:: Private residence Living Arrangements: Spouse/significant other Available Help at Discharge: Family;Available PRN/intermittently (Wife reports she can quit work if she needs to however not sure yet.) Type of Home: House Home Access: Stairs to enter  Entrance Stairs-Rails: Right Entrance Stairs-Number of Steps: 1 Home Layout: One level Home Equipment: Walker - 4 wheels;Walker - 2 wheels;Bedside commode      Prior  Function Level of Independence: Needs assistance   Gait / Transfers Assistance Needed: (I) with ambulation.  ADL's / Homemaking Assistance Needed: Up until 2 weeks ago, pt (I) with dressing, bathing however in the last 2 weeks pt has been refusing to bathe self and needing assist with getting dressed. 1 witnessed fall per wife however not sure if there have been more as pt home alone at times.   Comments: Wife reports pt has been getting difficult to care for at home due to increased agitation and confusion.      Hand Dominance        Extremity/Trunk Assessment   Upper Extremity Assessment: Defer to OT evaluation (Strong grip and UEs as observed during functional assessment.)           Lower Extremity Assessment: Generalized weakness;Difficult to assess due to impaired cognition         Communication      Cognition Arousal/Alertness: Awake/alert Behavior During Therapy: Agitated Overall Cognitive Status: Impaired/Different from baseline Area of Impairment: Orientation;Following commands;Safety/judgement;Problem solving;Memory Orientation Level: Disoriented to;Place;Time;Situation (Not able to state his birthday or his wife's birthday today.  States [facility] at the [facility]   Memory: Decreased short-term memory Following Commands: Follows one step commands inconsistently Safety/Judgement: Decreased awareness of safety;Decreased awareness of deficits   Problem Solving: Requires verbal cues;Decreased initiation;Slow processing General Comments: Pt became agitated when therapist attempted to assist with transfer to EOB grabbing therapist's arms tight and not letting go, "I will pull you over the side of this bed." Requires multiple cues to release grip on second occasion when testing strength. Kicked therapist when assessing quad strength and laughed. Reports seeing river out the window, "they can't swim over there." Nonsensical speech.    General Comments General  comments (skin integrity, edema, etc.): Multiple lesions/blisters throughout extremities and body. Pt picking at sores. Lengthy discussion with wife about drastic change in patient's behavior over the last 2 weeks. Wife concerned about functional decline and not sure what her options are. Discussed possible options for d/c, consult SW.    Exercises        Assessment/Plan    PT Assessment Patient needs continued PT services  PT Diagnosis Generalized weakness;Difficulty walking   PT Problem List Decreased strength;Decreased cognition;Decreased safety awareness;Decreased balance;Decreased knowledge of precautions  PT Treatment Interventions Balance training;Gait training;Cognitive remediation;Patient/family education;Functional mobility training;Therapeutic activities   PT Goals (Current goals can be found in the Care Plan section) Acute Rehab PT Goals Patient Stated Goal: Pt unable to state PT Goal Formulation: Patient unable to participate in goal setting Time For Goal Achievement: 01/02/14 Potential to Achieve Goals: Fair    Frequency Min 2X/week   Barriers to discharge Decreased caregiver support Wife works part time.    Co-evaluation               End of Session Equipment Utilized During Treatment: Gait belt Activity Tolerance: Treatment limited secondary to agitation Patient left: in chair;with call bell/phone within reach;with chair alarm set;with family/visitor present Nurse Communication: Mobility status;Precautions    Functional Assessment Tool Used: Clinical judgment Functional Limitation: Mobility: Walking and moving around Mobility: Walking and Moving Around Current Status ([id number]([id number]): At least 40 percent but less than 60 percent impaired, limited or restricted Mobility: Walking and Moving Around Goal Status [phone](G8979): At least 1 percent but  less than 20 percent impaired, limited or restricted    Time: 0917-0950 PT Time Calculation (min): 33 min   Charges:   PT  Evaluation $Initial PT Evaluation Tier I: 1 Procedure PT Treatments $Therapeutic Activity: 8-22 mins   PT G Codes:   Functional Assessment Tool Used: Clinical judgment Functional Limitation: Mobility: Walking and moving around    LatimerFolan, Iowahauna A 12/19/2013, 10:52 AM Alvie HeidelbergShauna Folan, PT, DPT [phone]
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
## 2013-12-19 NOTE — ED Provider Notes (Signed)
Medical screening examination/treatment/procedure(s) were conducted as a shared visit with non-physician practitioner(s) and myself.  I personally evaluated the patient during the encounter.   EKG Interpretation   Date/Time:  Wednesday December 18 2013 06:23:55 EDT Ventricular Rate:  90 PR Interval:  184 QRS Duration: 114 QT Interval:  389 QTC Calculation: 476 R Axis:   1 Text Interpretation:  Sinus rhythm No significant change since last  tracing Confirmed by Erroll Lunani, Adeleke Ayokunle [phone]([id number]) on 12/18/2013 6:42:48  AM      Pt comes in w/ wife for eval of fall and rash. The rash is diffuse, all ove rthe body, with scabs. Apparently, pt picks on them. There is erythema. He has purulence below his arm pit. Vitals are reassuring, but pt is not as active. Unsure what the cause of the rash is - but there is superimposed infection. Will give vanc and will need admission.  Jacob KaplanAnkit Bert Ptacek, MD 12/19/13 [phone]

## 2013-12-19 NOTE — Progress Notes (Signed)
BRIEF NUTRITION NOTE   INTERVENTION: Ensure Complete po PRN, each supplement provides 350 kcal and 13 grams of protein  NUTRITION DIAGNOSIS: Inadequate oral intake related to end of life care/comfort feedings as evidenced by meal completion <25%, comfort care.   Goal: Comfort feedings   Monitor:  Changes in goals of care.   Reason for Assessment: MD consult - wound healing/Pt identified as at nutrition risk on the Malnutrition Screen Tool  80 y.o. male  Admitting Dx: Fall at home  ASSESSMENT: Pt with hx of dementia, per MD notes pt has been sleeping all day and has had minimal intake and getting weaker. Pt admitted after fall at home. Pt is covered in sores and blisters. Per notes pt's PCP began discussion with wife about hospice, per MD wife wants pt to be comfort care with comfort feedings. Plan for SNF with hospice. Palliative care following.  Discussed nutrition plan with RN.   Height: Ht Readings from Last 1 Encounters:  12/18/13 6' (1.829 m)    Weight: Wt Readings from Last 1 Encounters:  12/18/13 215 lb 9.6 oz (97.796 kg)    BMI:  Body mass index is 29.23 kg/(m^2).  Skin: blister on right foot, sores on body  Diet Order: General Meal Completion: 25%  EDUCATION NEEDS: -No education needs identified at this time   Intake/Output Summary (Last 24 hours) at 12/19/13 1217 Last data filed at 12/19/13 1012  Gross per 24 hour  Intake    180 ml  Output      0 ml  Net    180 ml    Last BM: PTA   Labs:   Recent Labs Lab 12/13/13 1443 12/18/13 0946 12/19/13 0534  NA 136 139 137  K 3.7 4.2 3.9  CL 98 96 97  CO2 26 26 27   BUN 10 12 11   CREATININE 1.2 0.99 0.94  CALCIUM 8.6 8.9 8.3*  GLUCOSE 200* 109* 96    CBG (last 3)  No results found for this basename: GLUCAP,  in the last 72 hours  Scheduled Meds: . acidophilus  1 capsule Oral Daily  . aspirin EC  81 mg Oral Daily  . chlorhexidine   Topical Daily  . clindamycin (CLEOCIN) IV  300 mg  Intravenous 4 times per day  . enoxaparin (LOVENOX) injection  40 mg Subcutaneous Daily  . metoprolol succinate  50 mg Oral Daily  . mirtazapine  7.5 mg Oral QHS  . sodium chloride  3 mL Intravenous Q12H  . vitamin B-12  1,000 mcg Oral Daily    Continuous Infusions: . sodium chloride      Kendell BaneHeather Shaneisha Burkel RD, LDN, CNSC [phone] Pager [phone] After Hours Pager

## 2013-12-19 NOTE — Progress Notes (Signed)
Notified Craige CottaKirby, NP that pt refusing vital signs, personal care and turning at this time. Will continue to monitor pt. Nelda MarseilleJenny Thacker, RN

## 2013-12-19 NOTE — Consult Note (Addendum)
Met with patient and his wife. He has been declining for the past 6 months with issues related to his dementia. His behavior is becoming difficult to handle and his wife is having a difficult time caring for him at home.   1. Alzheimer's Dementia  Non verbal  Minimal PO intake  Ambulatory with assistance  Difficult behavior-not sleeping, refuses help with bathing/hygiene issues, agitation 2. Neurotic Excoriations 3. Impetigo Lesions 4. Fall at home 5. Acute Delirium 6. Caregiver burnout   We discussed overall goals for his care- she understands teh dementia disease progression.  Summary of Goals:  1. DNR 2. Full Comfort Care- allow for a natural death to occur 3. No rehospitalization 4. Hospice Referral- with support she thinks she can take him home 5. Comfort Feeding 6. No antibiotics unless they help with comfort ie for his impetigo lesions 7. Symptom Management- aggressively treat delirium and agitation   Recommendations:  1. Roxanol $RemoveBe'10mg'oXgzLRMaL$  q2 prn pain and distress 2. Sarna Lotion QID 3. Scheduled and PRN SL Ativan solution for agitation 4. Oral and topical antibiotics for his skin lesions- antihistamine trial for itching  Will follow for symptoms and additional care needs.  Lane Hacker, DO Palliative Medicine [phone]

## 2013-12-19 NOTE — Clinical Social Work Note (Signed)
CSW has received consult for patient regarding SNF placement. CSW met with wife today to complete assessment and start referral process. Wife states that she will want long term placement for the patient. Full assessment to come.  Liz Beach MSW, [place], [place], [phone]

## 2013-12-20 LAB — CULTURE, ROUTINE-ABSCESS

## 2013-12-20 MED ORDER — HALOPERIDOL 1 MG PO TABS
1.0000 mg | ORAL_TABLET | Freq: Four times a day (QID) | ORAL | Status: DC | PRN
  Administered 2013-12-21: 1 mg via ORAL
  Filled 2013-12-20 (×2): qty 1

## 2013-12-20 MED ORDER — CLINDAMYCIN PHOSPHATE 600 MG/50ML IV SOLN
600.0000 mg | Freq: Two times a day (BID) | INTRAVENOUS | Status: DC
  Administered 2013-12-20 – 2013-12-23 (×6): 600 mg via INTRAVENOUS
  Filled 2013-12-20 (×8): qty 50

## 2013-12-20 NOTE — Progress Notes (Signed)
Pt very uncooperative when providing personal care to patient. Pt trying to hit and kick staff. Changed patient's sheets. Will continue to monitor pt. Nelda MarseilleJenny Thacker, RN

## 2013-12-20 NOTE — Care Management Note (Signed)
    Page 1 of 1   12/23/2013     5:50:18 PM CARE MANAGEMENT NOTE 12/23/2013  Patient:  Jacob Costa,Jacob Costa   Account Number:  [id number]  Date Initiated:  12/20/2013  Documentation initiated by:  Letha CapeAYLOR,Wei Newbrough  Subjective/Objective Assessment:   dx cellulitis  admit-lives with spouse     Action/Plan:   pt eval- rec snf.   Anticipated DC Date:  12/23/2013   Anticipated DC Plan:  SKILLED NURSING FACILITY  In-house referral  Clinical Social Worker      DC Planning Services  CM consult      Choice offered to / List presented to:             Status of service:  Completed, signed off Medicare Important Message given?  YES (If response is "NO", the following Medicare IM given date fields will be blank) Date Medicare IM given:  12/20/2013 Medicare IM given by:  Letha CapeAYLOR,Mandie Crabbe Date Additional Medicare IM given:  12/23/2013 Additional Medicare IM given by:  Letha CapeEBORAH Rondle Lohse  Discharge Disposition:  SKILLED NURSING FACILITY  Per UR Regulation:  Reviewed for med. necessity/level of care/duration of stay  If discussed at Long Length of Stay Meetings, dates discussed:    Comments:  12/23/13 1749 Letha Capeeborah Hendryx Ricke RN, BSN [phone] patient dc to snf today, CSW following.  12/20/13 1701 Letha Capeeborah Kolby Myung RN BSN [phone] per pt eval rec snf, wife wants snf with palliative. CSW referral.

## 2013-12-20 NOTE — Progress Notes (Signed)
PROGRESS NOTE  Jacob Costa JYN:[id number] DOB: Feb 04, 1933 DOA: 12/18/2013 PCP: [name], MD  Assessment/Plan: Impetigo-IV vanc in ERx 1, clindamycin IV  Dementia with behavioral disturbances- at baseline,  palliative care consult- wife not sure she can take him home-  - wants him to be comfort care: will need to go to SNF with palliative following Sarna Lotion Scheduled and PRN SL Ativan solution for agitation  Oral and topical antibiotics for his skin lesions- antihistamine trial for itching  Leukocytosis- mild  Poor PO intake -IVF -change clinda to IV as patient not eating  Code Status: DNR Family Communication: LM for wife Disposition Plan:  SNF with palliative   Consultants:  Palliative care  Procedures:      HPI/Subjective: Picking at lesions  Objective: Filed Vitals:   12/20/13 0505  BP: 147/68  Pulse: 70  Temp: 98.6 F (37 C)  Resp: 20    Intake/Output Summary (Last 24 hours) at 12/20/13 1043 Last data filed at 12/20/13 1040  Gross per 24 hour  Intake 1218.75 ml  Output      0 ml  Net 1218.75 ml   Filed Weights   12/18/13 0619 12/18/13 1551  Weight: 99.791 kg (220 lb) 97.796 kg (215 lb 9.6 oz)    Exam:   General:  Pleasant/cooperative  Cardiovascular: rrr  Respiratory: clear  Abdomen: +BS, soft  Skin- lesions less read, still picking at lesions= no lesion son hands/feet soles or between fingers  Data Reviewed: Basic Metabolic Panel:  Recent Labs Lab 12/13/13 1443 12/18/13 0946 12/19/13 0534  NA 136 139 137  K 3.7 4.2 3.9  CL 98 96 97  CO2 26 26 27   GLUCOSE 200* 109* 96  BUN 10 12 11   CREATININE 1.2 0.99 0.94  CALCIUM 8.6 8.9 8.3*   Liver Function Tests:  Recent Labs Lab 12/13/13 1443 12/18/13 0946  AST 24 22  ALT 17 15  ALKPHOS 96 103  BILITOT 0.9 0.6  PROT 8.5* 8.6*  ALBUMIN 2.8* 2.9*   No results found for this basename: LIPASE, AMYLASE,  in the last 168 hours No results found for this  basename: AMMONIA,  in the last 168 hours CBC:  Recent Labs Lab 12/13/13 1443 12/18/13 0946 12/19/13 0534  WBC 11.9* 12.6* 9.8  NEUTROABS 9.8* 9.8*  --   HGB 12.6* 12.8* 11.4*  HCT 38.8* 39.8 36.4*  MCV 83.8 86.5 84.5  PLT 293.0 288 267   Cardiac Enzymes: No results found for this basename: CKTOTAL, CKMB, CKMBINDEX, TROPONINI,  in the last 168 hours BNP (last 3 results) No results found for this basename: PROBNP,  in the last 8760 hours CBG: No results found for this basename: GLUCAP,  in the last 168 hours  Recent Results (from the past 240 hour(s))  URINE CULTURE     Status: None   Collection Time    12/13/13  2:43 PM      Result Value Ref Range Status   Colony Count NO GROWTH   Final   Organism ID, Bacteria NO GROWTH   Final  CULTURE, ROUTINE-ABSCESS     Status: None   Collection Time    12/18/13  9:17 AM      Result Value Ref Range Status   Specimen Description ABSCESS AXILLA LEFT   Final   Special Requests NONE   Final   Gram Stain     Final   Value: MODERATE WBC PRESENT,BOTH PMN AND MONONUCLEAR     NO SQUAMOUS EPITHELIAL CELLS SEEN  ABUNDANT GRAM POSITIVE COCCI     IN PAIRS FEW GRAM NEGATIVE RODS     [facility]   Culture     Final   Value: MODERATE STAPHYLOCOCCUS AUREUS     Note: RIFAMPIN AND GENTAMICIN SHOULD NOT BE USED AS SINGLE DRUGS FOR TREATMENT OF STAPH INFECTIONS.     [facility]formed at [facility]   Report Status 12/20/2013 FINAL   Final   Organism ID, Bacteria STAPHYLOCOCCUS AUREUS   Final  URINE CULTURE     Status: None   Collection Time    12/18/13 12:02 PM      Result Value Ref Range Status   Specimen Description URINE, CATHETERIZED   Final   Special Requests NONE   Final   Culture  Setup Time     Final   Value: 12/18/2013 18:09     [facility]formed at [facility]   Colony Count     Final   Value: NO GROWTH     [facility]formed at [facility]   Culture     Final   Value: NO GROWTH     [facility]   Report Status 12/19/2013 FINAL   Final     Studies: No results found.  Scheduled Meds: . acidophilus  1 capsule Oral Daily  . aspirin EC  81 mg Oral Daily  . chlorhexidine   Topical Daily  . clindamycin (CLEOCIN) IV  300 mg Intravenous 4 times per day  . enoxaparin (LOVENOX) injection  40 mg Subcutaneous Daily  . metoprolol succinate  50 mg Oral Daily  . mirtazapine  7.5 mg Oral QHS  . sodium chloride  3 mL Intravenous Q12H  . vitamin B-12  1,000 mcg Oral Daily   Continuous Infusions: . sodium chloride 75 mL/hr at 12/20/13 0522   Antibiotics Given (last 72 hours)   Date/Time Action Medication Dose Rate   12/19/13 1210 Given   clindamycin (CLEOCIN) IVPB 300 mg 300 mg 100 mL/hr   12/19/13 1713 Given   clindamycin (CLEOCIN) IVPB 300 mg 300 mg 100 mL/hr   12/20/13 0015 Given   clindamycin (CLEOCIN) IVPB 300 mg 300 mg 100 mL/hr   12/20/13 0522 Given   clindamycin (CLEOCIN) IVPB 300 mg 300 mg 100 mL/hr      Active Problems:   Essential hypertension   Coronary atherosclerosis   Dementia with behavioral disturbance   Cellulitis   Impetigo   DNR (do not resuscitate)   Fall at home   Pruritic dermatitis    Time spent: 25 min    Marlin CanaryVANN, JESSICA  [facility] Pager [phone]. If 7PM-7AM, please contact night-coverage at [email or web address], password [facility] 12/20/2013, 10:43 AM  LOS: 2 days

## 2013-12-20 NOTE — Clinical Social Work Psychosocial (Signed)
Clinical Social Work Department BRIEF PSYCHOSOCIAL ASSESSMENT 12/20/2013  Patient:  Jacob Costa, Jacob Costa     Account Number:  [id number]     Admit date:  12/18/2013  Clinical Social Worker:  Lovey Newcomer  Date/Time:  12/19/2013 04:16 PM  Referred by:  Physician  Date Referred:  12/19/2013 Referred for  SNF Placement   Other Referral:   NA   Interview type:  Family Other interview type:   Patient's wife interviewed at bedside to complete assessment.    PSYCHOSOCIAL DATA Living Status:  WIFE Admitted from facility:   Level of care:   Primary support name:  Jacob Costa Primary support relationship to patient:  SPOUSE Degree of support available:   Support is strong.    CURRENT CONCERNS Current Concerns  Post-Acute Placement   Other Concerns:   NA    SOCIAL WORK ASSESSMENT / PLAN CSW met with patient and wife at bedside to complete assessment. Patient unable to contribute to assessment at this time. Patient's wife Jacob Costa states that the patient currently lives with her but she feels that she is no longer able to provide the necessary care to the patient and is requesting long term placement. CSW inquired about [name]'s plans to pay for long term care. She states that she will be filing for Medicaid in the morning with social services (information provided). She states that she does not currently have a preference for facility and expresses understanding that long term care options may be limited. Jacob Costa appears overwhelmed by the care needs of the patient. CSW explained SNF search/placement processed and answered questions. CSW will follow up with bed offers in the morning per wife's request.   Assessment/plan status:  Psychosocial Support/Ongoing Assessment of Needs Other assessment/ plan:   Complete FL2, Fax, PASRR   Information/referral to community resources:   CSW contact information and SNF list given.    PATIENT'S/FAMILY'S RESPONSE TO PLAN OF CARE: Patient's  wife plans for the patient to DC to SNF for long term care. CSW will assist as appropriate.       Liz Beach MSW, [place], [place], [phone]

## 2013-12-20 NOTE — Clinical Social Work Note (Addendum)
CSW met with patient's wife at bedside to give bed offers. Fl2 placed in chart for MD signature.   Update: Patient's wife has chosen long term care bed at [facility]. Patient is able to go to [facility] with palliative services when ready for DC. Patient's wife has applied for long term Medicaid, but patient does not currently have this.   Liz Beach MSW, [place], [place], [phone]

## 2013-12-20 NOTE — Clinical Social Work Placement (Addendum)
Clinical Social Work Department CLINICAL SOCIAL WORK PLACEMENT NOTE 12/20/2013  Patient:  Orland JarredMORTON,Tyquan D  Account Number:  [id number] Admit date:  12/18/2013  Clinical Social Worker:  Cherre BlancJOSEPH BRYANT Tatsuo Musial, [place]  Date/time:  12/19/2013 04:30 PM  Clinical Social Work is seeking post-discharge placement for this patient at the following level of care:   SKILLED NURSING   (*CSW will update this form in Epic as items are completed)   12/19/2013  [facility]/family provided with [facility] of Clinical Social Work's list of facilities offering this level of care within the geographic area requested by the patient (or if unable, by the patient's family).  12/19/2013  Patient/family informed of their freedom to choose among providers that offer the needed level of care, that participate in Medicare, Medicaid or managed care program needed by the patient, have an available bed and are willing to accept the patient.  12/19/2013  Patient/family informed of [facility]' ownership interest in [facility], as well as of the fact that they are under no obligation to receive care at this facility.  PASARR submitted to EDS on 12/19/2013 PASARR number received on 12/19/2013  FL2 transmitted to all facilities in geographic area requested by pt/family on  12/19/2013 FL2 transmitted to all facilities within larger geographic area on   Patient informed that his/her managed care company has contracts with or will negotiate with  certain facilities, including the following:     Patient/family informed of bed offers received:  12/20/13 Patient chooses bed at  Physician recommends and patient chooses bed at    Patient to be transferred to  on   Patient to be transferred to facility by  Patient and family notified of transfer on  Name of family member notified:    The following physician request were entered in Epic:   Additional Comments:    Roddie McBryant Sibyl Mikula MSW, [place], [place],  [phone]

## 2013-12-21 DIAGNOSIS — Z515 Encounter for palliative care: Secondary | ICD-10-CM

## 2013-12-21 NOTE — Progress Notes (Addendum)
PROGRESS NOTE  Jacob Jarredllen D Linsey XBJ:[id number] DOB: [birth year] DOA: 12/18/2013 PCP: [name], MD  Jacob Costa is a 80 y.o. male, who had been sleeping all day, minimal intake, and getting weaker. They had seen their PCP who has been working it up. Wife stated that the PCP has explained that this is the progressive nature of dementia. Wife also stated that the PCP has also discussed hospice care  He got out of bed and fell.   Assessment/Plan: Impetigo-IV vanc in ERx 1, clindamycin IV  Dementia with behavioral disturbances- at baseline,  palliative care consult- wife not able to take him home-  - wants him to be comfort care: will need to go to SNF with palliative following Sarna Lotion Scheduled and PRN SL Ativan solution for agitation  Oral and topical antibiotics for his skin lesions- antihistamine trial for itching  Leukocytosis- mild  Poor PO intake -IVF d/c -change clinda to IV as patient not eating  Spoke with wife at length about patient continuing to pick at lesions, not going to heal until then   Code Status: DNR Family Communication: LM for wife 10/23- spoke with wife 10/24 Disposition Plan:  SNF with palliative   Consultants:  Palliative care  Procedures:      HPI/Subjective: Picking at lesions  Objective: Filed Vitals:   12/21/13 0539  BP: 140/92  Pulse: 85  Temp: 97.9 F (36.6 C)  Resp: 18    Intake/Output Summary (Last 24 hours) at 12/21/13 0814 Last data filed at 12/21/13 0800  Gross per 24 hour  Intake 2178.75 ml  Output   1000 ml  Net 1178.75 ml   Filed Weights   12/18/13 0619 12/18/13 1551  Weight: 99.791 kg (220 lb) 97.796 kg (215 lb 9.6 oz)    Exam:   General:  Pleasant/cooperative  Cardiovascular: rrr  Respiratory: clear  Abdomen: +BS, soft  Skin- lesions less read, still picking at lesions= no lesions on hands/feet soles or between fingers, largest lesions on scrotum and abd  Data Reviewed: Basic Metabolic  Panel:  Recent Labs Lab 12/18/13 0946 12/19/13 0534  NA 139 137  K 4.2 3.9  CL 96 97  CO2 26 27  GLUCOSE 109* 96  BUN 12 11  CREATININE 0.99 0.94  CALCIUM 8.9 8.3*   Liver Function Tests:  Recent Labs Lab 12/18/13 0946  AST 22  ALT 15  ALKPHOS 103  BILITOT 0.6  PROT 8.6*  ALBUMIN 2.9*   No results found for this basename: LIPASE, AMYLASE,  in the last 168 hours No results found for this basename: AMMONIA,  in the last 168 hours CBC:  Recent Labs Lab 12/18/13 0946 12/19/13 0534  WBC 12.6* 9.8  NEUTROABS 9.8*  --   HGB 12.8* 11.4*  HCT 39.8 36.4*  MCV 86.5 84.5  PLT 288 267   Cardiac Enzymes: No results found for this basename: CKTOTAL, CKMB, CKMBINDEX, TROPONINI,  in the last 168 hours BNP (last 3 results) No results found for this basename: PROBNP,  in the last 8760 hours CBG: No results found for this basename: GLUCAP,  in the last 168 hours  Recent Results (from the past 240 hour(s))  URINE CULTURE     Status: None   Collection Time    12/13/13  2:43 PM      Result Value Ref Range Status   Colony Count NO GROWTH   Final   Organism ID, Bacteria NO GROWTH   Final  CULTURE, ROUTINE-ABSCESS  Status: None   Collection Time    12/18/13  9:17 AM      Result Value Ref Range Status   Specimen Description ABSCESS AXILLA LEFT   Final   Special Requests NONE   Final   Gram Stain     Final   Value: MODERATE WBC PRESENT,BOTH PMN AND MONONUCLEAR     NO SQUAMOUS EPITHELIAL CELLS SEEN     ABUNDANT GRAM POSITIVE COCCI     IN PAIRS FEW GRAM NEGATIVE RODS     Performed at [facility]   Culture     Final   Value: MODERATE STAPHYLOCOCCUS AUREUS     Note: RIFAMPIN AND GENTAMICIN SHOULD NOT BE USED AS SINGLE DRUGS FOR TREATMENT OF STAPH INFECTIONS.     Performed at [facility]   Report Status 12/20/2013 FINAL   Final   Organism ID, Bacteria STAPHYLOCOCCUS AUREUS   Final  URINE CULTURE     Status: None   Collection Time    12/18/13 12:02 PM       Result Value Ref Range Status   Specimen Description URINE, CATHETERIZED   Final   Special Requests NONE   Final   Culture  Setup Time     Final   Value: 12/18/2013 18:09     Performed at [facility]   Colony Count     Final   Value: NO GROWTH     Performed at [facility]   Culture     Final   Value: NO GROWTH     Performed at [facility]   Report Status 12/19/2013 FINAL   Final     Studies: No results found.  Scheduled Meds: . acidophilus  1 capsule Oral Daily  . aspirin EC  81 mg Oral Daily  . chlorhexidine   Topical Daily  . clindamycin (CLEOCIN) IV  600 mg Intravenous Q12H  . enoxaparin (LOVENOX) injection  40 mg Subcutaneous Daily  . metoprolol succinate  50 mg Oral Daily  . mirtazapine  7.5 mg Oral QHS  . sodium chloride  3 mL Intravenous Q12H  . vitamin B-12  1,000 mcg Oral Daily   Continuous Infusions: . sodium chloride 75 mL/hr (12/20/13 2040)   Antibiotics Given (last 72 hours)   Date/Time Action Medication Dose Rate   12/19/13 1210 Given   clindamycin (CLEOCIN) IVPB 300 mg 300 mg 100 mL/hr   12/19/13 1713 Given   clindamycin (CLEOCIN) IVPB 300 mg 300 mg 100 mL/hr   12/20/13 0015 Given   clindamycin (CLEOCIN) IVPB 300 mg 300 mg 100 mL/hr   12/20/13 0522 Given   clindamycin (CLEOCIN) IVPB 300 mg 300 mg 100 mL/hr   12/20/13 1204 Given   clindamycin (CLEOCIN) IVPB 300 mg 300 mg 100 mL/hr   12/20/13 1815 Given   clindamycin (CLEOCIN) IVPB 300 mg 300 mg 100 mL/hr   12/20/13 2044 Given   clindamycin (CLEOCIN) IVPB 600 mg 600 mg 100 mL/hr      Active Problems:   Essential hypertension   Coronary atherosclerosis   Dementia with behavioral disturbance   Cellulitis   Impetigo   DNR (do not resuscitate)   Fall at home   Pruritic dermatitis    Time spent: 25 min    Marlin CanaryVANN, Murrel Bertram  [facility] Pager [phone]. If 7PM-7AM, please contact night-coverage at [email or web address], password [email or web address] 12/21/2013, 8:14 AM  LOS: 3 days

## 2013-12-22 DIAGNOSIS — Z66 Do not resuscitate: Secondary | ICD-10-CM

## 2013-12-22 MED ORDER — HALOPERIDOL 2 MG PO TABS
2.0000 mg | ORAL_TABLET | Freq: Four times a day (QID) | ORAL | Status: DC | PRN
  Filled 2013-12-22: qty 1

## 2013-12-22 NOTE — Progress Notes (Signed)
IV team placed a new IV but before I could get his antibiotic started he pulled another IV out.  IV nurse still on the unit and I requested her services again.  IV completed the IV insertion, antibiotic started, patient is confused, restless, and becoming more agitated.  I have applied the green mittens and patient continues to remove them.  We will continue to monitor.  Macarthur CritchleyMarie Teyla Skidgel, RN

## 2013-12-22 NOTE — Progress Notes (Addendum)
PROGRESS NOTE  Jacob Costa ZOX:[id number] DOB: [birth year] DOA: 12/18/2013 PCP: [name], MD  Jacob Costa is a 80 y.o. male, who had been sleeping all day, minimal intake, and getting weaker. He got out of bed and fell.  They had seen their PCP who has been working it up. Wife stated that the PCP has explained that this is the progressive nature of dementia. Wife also stated that the PCP has also discussed hospice care    Assessment/Plan: Cellulitis- culture grew staph au. (done in ER)-IV vanc in ERx 1, clindamycin IV until d/c- have been unable to get patient to stop scratching his wounds and picking the scabs off -wound care consult  Dementia with behavioral disturbances- at baseline,  palliative care consult- wife not able to take him home-  -will need to go to SNF with palliative following Sarna Lotion Scheduled and PRN SL Ativan solution for agitation  -haldol PRN as well - antihistamine trial for itching  Leukocytosis- mild  Poor PO intake Improved with hydration     Code Status: DNR Family Communication: LM for wife 10/23- spoke with wife 10/24 Disposition Plan:  SNF with palliative   Consultants:  Palliative care  Procedures:      HPI/Subjective: Picking at lesions Had periods of agitation last PM  Objective: Filed Vitals:   12/22/13 0540  BP: 128/64  Pulse: 70  Temp: 97.4 F (36.3 C)  Resp: 18    Intake/Output Summary (Last 24 hours) at 12/22/13 1046 Last data filed at 12/22/13 0557  Gross per 24 hour  Intake    100 ml  Output      0 ml  Net    100 ml   Filed Weights   12/18/13 0619 12/18/13 1551  Weight: 99.791 kg (220 lb) 97.796 kg (215 lb 9.6 oz)    Exam:   General:  Pleasant/cooperative  Cardiovascular: rrr  Respiratory: clear  Abdomen: +BS, soft  Skin- lesions less read, still picking at lesions; no lesions on hands/feet soles or between fingers or back where patient can not reach, largest lesions on scrotum  and abd- weeping- patient pulls off dressing  Data Reviewed: Basic Metabolic Panel:  Recent Labs Lab 12/18/13 0946 12/19/13 0534  NA 139 137  K 4.2 3.9  CL 96 97  CO2 26 27  GLUCOSE 109* 96  BUN 12 11  CREATININE 0.99 0.94  CALCIUM 8.9 8.3*   Liver Function Tests:  Recent Labs Lab 12/18/13 0946  AST 22  ALT 15  ALKPHOS 103  BILITOT 0.6  PROT 8.6*  ALBUMIN 2.9*   No results found for this basename: LIPASE, AMYLASE,  in the last 168 hours No results found for this basename: AMMONIA,  in the last 168 hours CBC:  Recent Labs Lab 12/18/13 0946 12/19/13 0534  WBC 12.6* 9.8  NEUTROABS 9.8*  --   HGB 12.8* 11.4*  HCT 39.8 36.4*  MCV 86.5 84.5  PLT 288 267   Cardiac Enzymes: No results found for this basename: CKTOTAL, CKMB, CKMBINDEX, TROPONINI,  in the last 168 hours BNP (last 3 results) No results found for this basename: PROBNP,  in the last 8760 hours CBG: No results found for this basename: GLUCAP,  in the last 168 hours  Recent Results (from the past 240 hour(s))  URINE CULTURE     Status: None   Collection Time    12/13/13  2:43 PM      Result Value Ref Range Status   Colony Count  NO GROWTH   Final   Organism ID, Bacteria NO GROWTH   Final  CULTURE, ROUTINE-ABSCESS     Status: None   Collection Time    12/18/13  9:17 AM      Result Value Ref Range Status   Specimen Description ABSCESS AXILLA LEFT   Final   Special Requests NONE   Final   Gram Stain     Final   Value: MODERATE WBC PRESENT,BOTH PMN AND MONONUCLEAR     NO SQUAMOUS EPITHELIAL CELLS SEEN     ABUNDANT GRAM POSITIVE COCCI     IN PAIRS FEW GRAM NEGATIVE RODS     Performed at [facility]   Culture     Final   Value: MODERATE STAPHYLOCOCCUS AUREUS     Note: RIFAMPIN AND GENTAMICIN SHOULD NOT BE USED AS SINGLE DRUGS FOR TREATMENT OF STAPH INFECTIONS.     Performed at [facility]   Report Status 12/20/2013 FINAL   Final   Organism ID, Bacteria STAPHYLOCOCCUS AUREUS    Final  URINE CULTURE     Status: None   Collection Time    12/18/13 12:02 PM      Result Value Ref Range Status   Specimen Description URINE, CATHETERIZED   Final   Special Requests NONE   Final   Culture  Setup Time     Final   Value: 12/18/2013 18:09     Performed at [facility]   Colony Count     Final   Value: NO GROWTH     Performed at [facility]   Culture     Final   Value: NO GROWTH     Performed at [facility]   Report Status 12/19/2013 FINAL   Final     Studies: No results found.  Scheduled Meds: . acidophilus  1 capsule Oral Daily  . aspirin EC  81 mg Oral Daily  . clindamycin (CLEOCIN) IV  600 mg Intravenous Q12H  . enoxaparin (LOVENOX) injection  40 mg Subcutaneous Daily  . metoprolol succinate  50 mg Oral Daily  . mirtazapine  7.5 mg Oral QHS  . sodium chloride  3 mL Intravenous Q12H  . vitamin B-12  1,000 mcg Oral Daily   Continuous Infusions:   Antibiotics Given (last 72 hours)   Date/Time Action Medication Dose Rate   12/19/13 1210 Given   clindamycin (CLEOCIN) IVPB 300 mg 300 mg 100 mL/hr   12/19/13 1713 Given   clindamycin (CLEOCIN) IVPB 300 mg 300 mg 100 mL/hr   12/20/13 0015 Given   clindamycin (CLEOCIN) IVPB 300 mg 300 mg 100 mL/hr   12/20/13 0522 Given   clindamycin (CLEOCIN) IVPB 300 mg 300 mg 100 mL/hr   12/20/13 1204 Given   clindamycin (CLEOCIN) IVPB 300 mg 300 mg 100 mL/hr   12/20/13 1815 Given   clindamycin (CLEOCIN) IVPB 300 mg 300 mg 100 mL/hr   12/20/13 2044 Given   clindamycin (CLEOCIN) IVPB 600 mg 600 mg 100 mL/hr   12/21/13 1300 Given   clindamycin (CLEOCIN) IVPB 600 mg 600 mg 100 mL/hr   12/21/13 2340 Given   clindamycin (CLEOCIN) IVPB 600 mg 600 mg 100 mL/hr      Active Problems:   Essential hypertension   Coronary atherosclerosis   Dementia with behavioral disturbance   Cellulitis   Impetigo   DNR (do not resuscitate)   Fall at home   Pruritic dermatitis   Palliative care  patient  Time spent: 25 min    Marlin Canary  [facility] Pager [phone]. If 7PM-7AM, please contact night-coverage at [email or web address], password [facility] 12/22/2013, 10:46 AM  LOS: 4 days

## 2013-12-22 NOTE — Progress Notes (Signed)
Noted plans to go to SNF and that goals are comfort care and QOL. Wife cannot care for him at home and is overwhelmed. Recommend making sure there is a MOST form on his chart that indicates a desire for comfort measures and no rehospitalization-also he will need palliative care to follow at SNF and transition to Hospice Facility at appropriate time.  Anderson MaltaElizabeth Golding, DO Palliative Medicine [phone]

## 2013-12-22 NOTE — Progress Notes (Signed)
Patient is picking at his scabs and scratching his extremities.  Patient removed dressing on abdomen, replaced the abdominal dressing.  New foam dressing on upper left arm.  Both areas have serous drainage.  Patient refuses sarna lotion, benadryl has a mild relief.  Will continue to monitor.  Macarthur CritchleyMarie Ingram Onnen, RN

## 2013-12-22 NOTE — Progress Notes (Signed)
Patient pleasantly confused, restless, and agitated.  Patient pulled IV out and have called IV team to come and stick him as I did not see anything.  Will continue to monitor.  Macarthur CritchleyMarie Kynsley Whitehouse, RN

## 2013-12-23 ENCOUNTER — Encounter: Payer: Self-pay | Admitting: Internal Medicine

## 2013-12-23 DIAGNOSIS — I10 Essential (primary) hypertension: Secondary | ICD-10-CM | POA: Diagnosis present

## 2013-12-23 DIAGNOSIS — L981 Factitial dermatitis: Secondary | ICD-10-CM

## 2013-12-23 MED ORDER — ACETAMINOPHEN 650 MG RE SUPP: 650.0000 mg | Freq: Four times a day (QID) | RECTAL | Status: AC | PRN

## 2013-12-23 MED ORDER — HALOPERIDOL 2 MG PO TABS: 2.0000 mg | ORAL_TABLET | Freq: Four times a day (QID) | ORAL | Status: AC | PRN

## 2013-12-23 MED ORDER — DIPHENHYDRAMINE HCL 25 MG PO CAPS: 50.0000 mg | ORAL_CAPSULE | Freq: Four times a day (QID) | ORAL | Status: AC | PRN

## 2013-12-23 MED ORDER — ACETAMINOPHEN 325 MG PO TABS: 650.0000 mg | ORAL_TABLET | Freq: Four times a day (QID) | ORAL | Status: AC | PRN

## 2013-12-23 MED ORDER — LORAZEPAM 0.5 MG PO TABS: 1.0000 mg | ORAL_TABLET | Freq: Three times a day (TID) | ORAL | Status: AC | PRN

## 2013-12-23 NOTE — Progress Notes (Signed)
Discharge education completed by RN. IV removed, site is within normal limits. Pt will discharge from the unit via [facility]AR. 

## 2013-12-23 NOTE — Progress Notes (Signed)
PT Cancellation Note  Patient Details Name: Orland Jarredllen D Hennigan MRN: [id number] DOB: 30-May-1932   Cancelled Treatment:    Reason Eval/Treat Not Completed: Patient at procedure or test/unavailable  Pt [facility] SN[facility] ambulance [facility]  Alvie HeidelbergFolan, Cami Delawder A 12/23/2013, 5:01 PM ,Alvie HeidelbergShauna Folan, PT, DPT [phone]

## 2013-12-23 NOTE — Progress Notes (Signed)
[facility].
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
## 2013-12-23 NOTE — Clinical Social Work Placement (Signed)
Clinical Social Work Department CLINICAL SOCIAL WORK PLACEMENT NOTE 12/23/2013  Patient:  Jacob Costa,Jacob Costa  Account Number:  [id number] Admit date:  12/18/2013  Clinical Social Worker:  Cherre BlancJOSEPH BRYANT [name], [place]  Date/time:  12/19/2013 04:30 PM  Clinical Social Work is seeking post-discharge placement for this patient at the following level of care:   SKILLED NURSING   (*CSW will update this form in Epic as items are completed)   12/19/2013  Patient/family provided with [facility] Department of Clinical Social Work's list of facilities offering this level of care within the geographic area requested by the patient (or if unable, by the patient's family).  12/19/2013  Patient/family informed of their freedom to choose among providers that offer the needed level of care, that participate in Medicare, Medicaid or managed care program needed by the patient, have an available bed and are willing to accept the patient.  12/19/2013  Patient/family informed of [facility]' ownership interest in [facility], as well as of the fact that they are under no obligation to receive care at this facility.  PASARR submitted to EDS on 12/19/2013 PASARR number received on 12/19/2013  FL2 transmitted to all facilities in geographic area requested by pt/family on  12/19/2013 FL2 transmitted to all facilities within larger geographic area on   Patient informed that his/her managed care company has contracts with or will negotiate with  certain facilities, including the following:     Patient/family informed of bed offers received:  12/20/2013 [facility] [place] Physician recommends and patient chooses bed at    [facility] [place] [facility]  12/23/2013 Patient to be transferred to facility by Ambulance Patient and family notified of transfer on 12/23/2013 Name of family member notified:  Jacob Costa at bedside  The  following physician request were entered in Epic:   Additional Comments:   Per MD [facility]. RN, patient, patient's family, and facility notified of DC. RN given number for report. DC packet on chart. AMbulance transport requested for patient. CSW signing off.   Jacob Costa MSW, [place]CSWA, [place], [phone] .

## 2013-12-23 NOTE — Telephone Encounter (Signed)
appt marked as a no show b/c he called the day of to cancel his appt but a no show letter was not sent / Roanna RaiderSherri

## 2013-12-23 NOTE — Consult Note (Signed)
WOC wound consult note Reason for Consult: consult requested for abd wound.  This is the most significant area; patient has multiple full and partail thickness open wounds all over body.  EMR states they are R/T impetigo, this medical condition and topical treatment is [facility] scope of practice. Please consult dermatology for further plan of care.   Wound type:  Full thickness Measurement: Abd 9X12X.1cm Wound bed:Red and moist Drainage (amount, consistency, odor)  Mod amt yellow drainage  Periwound: Intact skin surrounding Dressing procedure/placement/frequency:Foam dressing to protect and absorb drainage to any open weeping sites.   Please re-consult if further assistance is needed.  Thank-you,  Cammie Mcgeeawn Dhaval Woo MSN, RN, CWOCN, [place]CN-AP, CNS [phone]

## 2013-12-24 ENCOUNTER — Non-Acute Institutional Stay (SKILLED_NURSING_FACILITY): Payer: Medicare Other | Admitting: Internal Medicine

## 2013-12-24 DIAGNOSIS — R627 Adult failure to thrive: Secondary | ICD-10-CM

## 2013-12-24 DIAGNOSIS — L0100 Impetigo, unspecified: Secondary | ICD-10-CM

## 2013-12-24 DIAGNOSIS — F03918 Unspecified dementia, unspecified severity, with other behavioral disturbance: Secondary | ICD-10-CM

## 2013-12-24 DIAGNOSIS — I2510 Atherosclerotic heart disease of native coronary artery without angina pectoris: Secondary | ICD-10-CM

## 2013-12-24 DIAGNOSIS — Y92009 Unspecified place in unspecified non-institutional (private) residence as the place of occurrence of the external cause: Secondary | ICD-10-CM

## 2013-12-24 DIAGNOSIS — F0391 Unspecified dementia with behavioral disturbance: Secondary | ICD-10-CM

## 2013-12-24 DIAGNOSIS — L981 Factitial dermatitis: Secondary | ICD-10-CM

## 2013-12-24 DIAGNOSIS — W19XXXD Unspecified fall, subsequent encounter: Secondary | ICD-10-CM

## 2013-12-24 DIAGNOSIS — I10 Essential (primary) hypertension: Secondary | ICD-10-CM
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
## 2013-12-25 ENCOUNTER — Encounter: Payer: Self-pay | Admitting: Internal Medicine

## 2013-12-26 ENCOUNTER — Other Ambulatory Visit: Payer: Self-pay | Admitting: *Deleted

## 2013-12-26 MED ORDER — LORAZEPAM 1 MG PO TABS: ORAL_TABLET | ORAL | Status: AC

## 2013-12-26 NOTE — Telephone Encounter (Signed)
[facility] 

## 2014-01-07 ENCOUNTER — Non-Acute Institutional Stay (SKILLED_NURSING_FACILITY): Payer: Medicare Other | Admitting: Internal Medicine

## 2014-01-07 DIAGNOSIS — R627 Adult failure to thrive: Secondary | ICD-10-CM

## 2014-01-07 DIAGNOSIS — L0100 Impetigo, unspecified: Secondary | ICD-10-CM

## 2014-01-07 DIAGNOSIS — R410 Disorientation, unspecified: Secondary | ICD-10-CM

## 2014-01-07 NOTE — Progress Notes (Signed)
Patient ID: Jacob Costa, male   DOB: 1932-06-17, 80 y.o.   MRN: [id number]  [facility] Provider:  Gwenith Spitziffany L. Renato Gailseed, D.O., C.M.D.  Code Status:  DNR  Chief Complaint  Patient presents with  . Acute Visit    agitation, threw dirty depend at staff, threw tray multiple times, refused blood draw;  urine sample negative for UTI;  is getting bleach bath for MSSA rash    HPI:  80 yo white male here for "rehab" s/p hospitalization with terrible impetigo.  He has dementia and has been overtly delirious the majority of his stay, but this has gotten dramatically worse if the past couple of days.  I saw him with his wife and the unit supervisor.  His rash has actually improved with the bleach and antibiotics.  He remains unwilling to participate in care and must be pleaded with to permit examination.  He remains in some discomfort and itchy despite best measures.  His wife wants to make sure there is nothing else going on that we can reverse.  We have already ruled out UTI and will attempt again to obtain cbc and cmp to assess for electrolyte and lab abnormalities as his intake has also gone down.  I did discuss with her that he may be in his last days and that can be why he is so delirious.  She was not ready to accept that yet.    Review of Systems:  Review of Systems  Unable to perform ROS: dementia  severe hyperactive delirium  Medications: Patient's Medications  New Prescriptions   No medications on file  Previous Medications   ACETAMINOPHEN (TYLENOL) 325 MG TABLET    Take 2 tablets (650 mg total) by mouth every 6 (six) hours as needed for mild pain (or Fever >/= 101).   ACETAMINOPHEN (TYLENOL) 650 MG SUPPOSITORY    Place 1 suppository (650 mg total) rectally every 6 (six) hours as needed for mild pain (or Fever >/= 101).   DIPHENHYDRAMINE (BENADRYL) 25 MG CAPSULE    Take 2 capsules (50 mg total) by mouth every 6 (six) hours as needed for itching.   HALOPERIDOL  (HALDOL) 2 MG TABLET    Take 1 tablet (2 mg total) by mouth every 6 (six) hours as needed for agitation.   LORAZEPAM (ATIVAN) 0.5 MG TABLET    Take 2 tablets (1 mg total) by mouth every 8 (eight) hours as needed for anxiety.   LORAZEPAM (ATIVAN) 1 MG TABLET    Take one tablet by mouth every 8 hours as needed for anxiety   MIRTAZAPINE (REMERON) 7.5 MG TABLET    Take 7.5 mg by mouth at bedtime.  Modified Medications   No medications on file  Discontinued Medications   No medications on file    Physical Exam: Filed Vitals:   01/19/14 1918  BP: 132/70  Pulse: 84  Temp: 98.1 F (36.7 C)  Resp: 20  Height: 5\' 10"  (1.778 m)  Weight: 216 lb (97.977 kg)   Physical Exam  Constitutional: He appears distressed.  Agitated, not staying still in bed, itching or pulling covers over him  Cardiovascular: Regular rhythm, normal heart sounds and intact distal pulses.   tachy  Pulmonary/Chest: Effort normal and breath sounds normal.  Neurological: He is alert.  Skin:  Fairly diffuse rash mostly dried up eschars now over some large excoriated areas, no vesicles remain  Psychiatric:  Disoriented, agitated and confused     Labs reviewed: Basic  Metabolic Panel:  Recent Labs  16/11/9608/16/15 1443 12/18/13 0946 12/19/13 0534  NA 136 139 137  K 3.7 4.2 3.9  CL 98 96 97  CO2 26 26 27   GLUCOSE 200* 109* 96  BUN 10 12 11   CREATININE 1.2 0.99 0.94  CALCIUM 8.6 8.9 8.3*    Liver Function Tests:  Recent Labs  12/13/13 1443 12/18/13 0946  AST 24 22  ALT 17 15  ALKPHOS 96 103  BILITOT 0.9 0.6  PROT 8.5* 8.6*  ALBUMIN 2.8* 2.9*    CBC:  Recent Labs  12/13/13 1443 12/18/13 0946 12/19/13 0534  WBC 11.9* 12.6* 9.8  NEUTROABS 9.8* 9.8*  --   HGB 12.6* 12.8* 11.4*  HCT 38.8* 39.8 36.4*  MCV 83.8 86.5 84.5  PLT 293.0 288 267    Assessment/Plan 1. Acute delirium -on dementia -I suspect this is terminal in his case--his po intake has diminished, he is on several meds that increase  confusion but have been necessary to help his rash and itching as well as anxiety--ativan, benadryl -no UTI -will r/o other source of infection and electrolyte abnormality or renal failure with labs if he allows staff to obtain  2. Impetigo -completing doxycycline, cont bleach baths which have helped -cont benadryl for itching and ativan for anxiety   3. Adult failure to thrive -he is not doing well--intake poor, has lost a lot of weight and remains delirious -cont comfort meds with haldol, ativan, remeron, tylenol and benadryl  Family/ staff Communication: seen with unit supervisor and his wife  Goals of care: cont comfort measures  Labs/tests ordered: cbc, cmp

## 2014-01-09 ENCOUNTER — Ambulatory Visit: Payer: Medicare Other | Admitting: Internal Medicine

## 2014-01-19 ENCOUNTER — Encounter: Payer: Self-pay | Admitting: Internal Medicine

## 2014-01-28 DEATH — deceased

## 2014-12-01 IMAGING — CT CT HEAD W/O CM
1 series · 16 of 30 positions shown, 20 images · non-contrast
Comparison: MR head 12/16/2009.

CLINICAL DATA: Patient fell at home earlier today. No reported loss
of consciousness. Patient takes aspirin.

EXAM:
CT HEAD WITHOUT CONTRAST
TECHNIQUE: Contiguous axial images were obtained from the base of the skull
through the vertex without contrast.

[Series 2: head 5.0 h30s · axial · 0.44mm/px · z∈[+1284,+1434]mm · 16 of 34 slices shown, 20 images]
[im 2/34  brain]
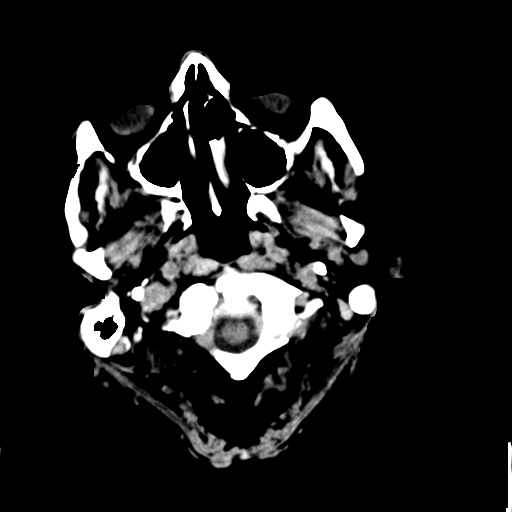
[im 2/34  bone]
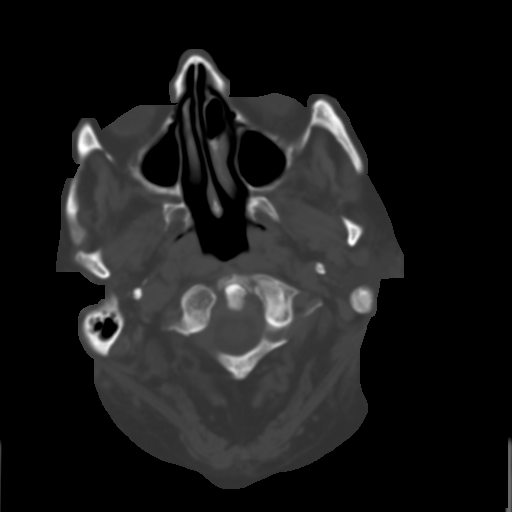
[im 4/34  brain]
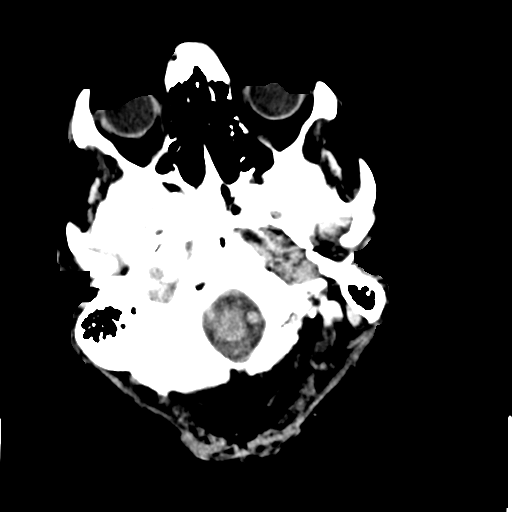
[im 6/34  brain]
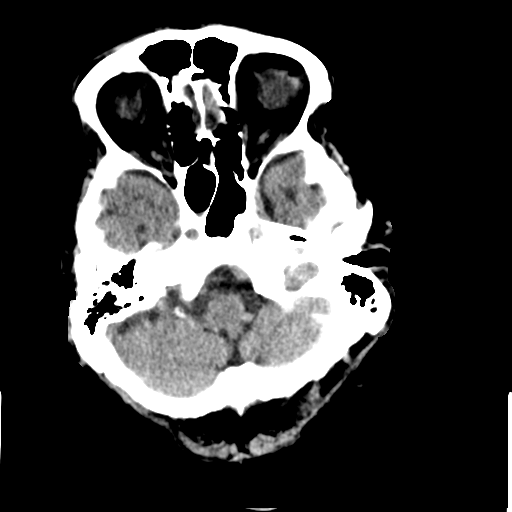
[im 8/34  brain]
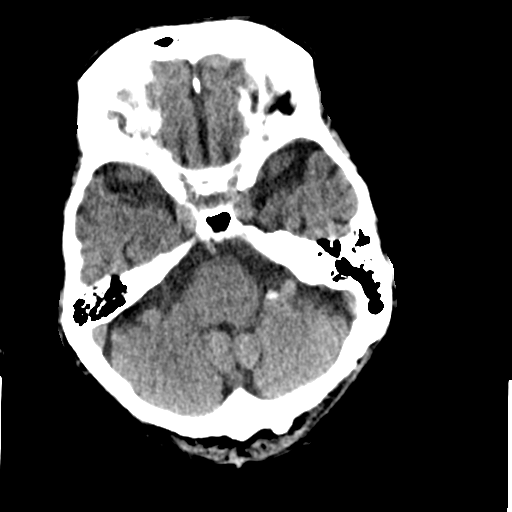
[im 10/34  brain]
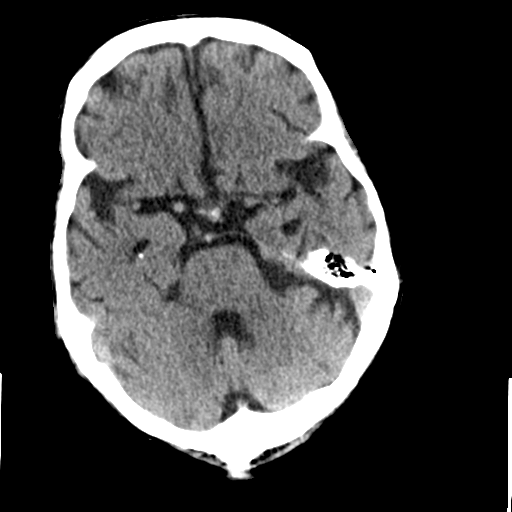
[im 10/34  bone]
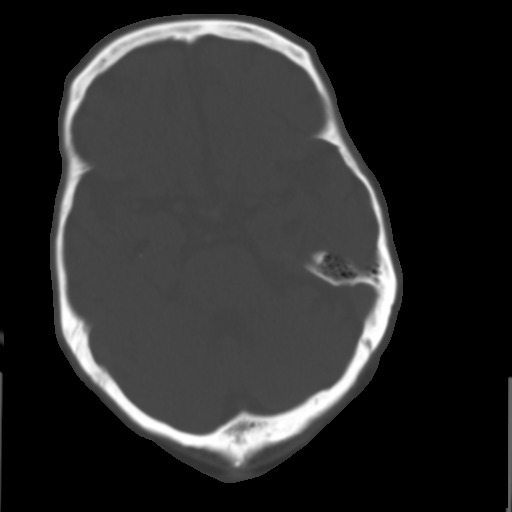
[im 12/34  brain]
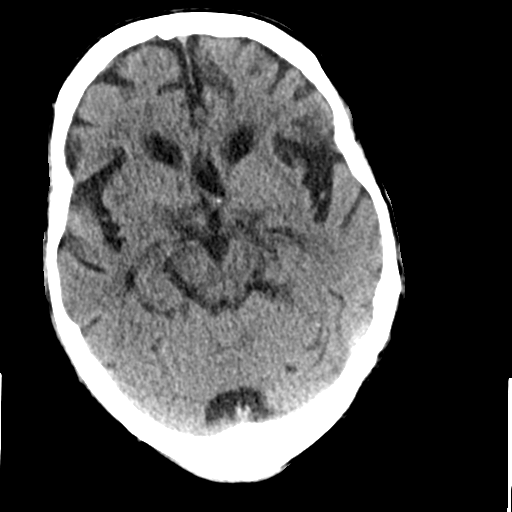
[im 14/34  brain]
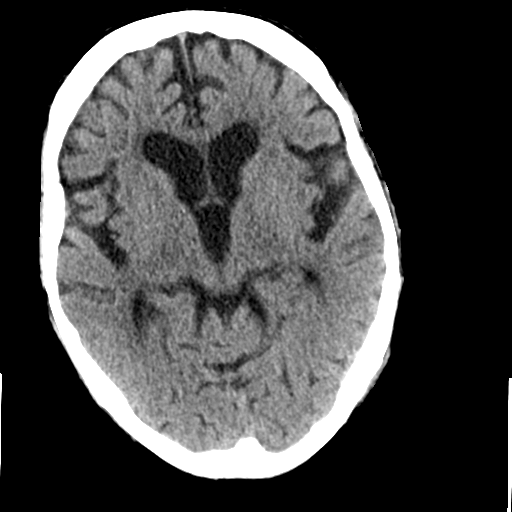
[im 16/34  brain]
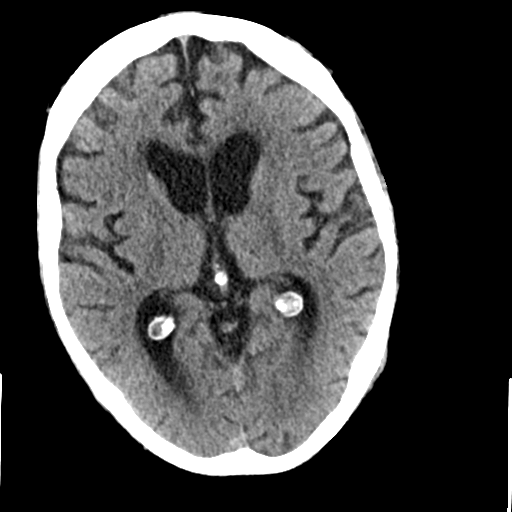
[im 18/34  brain]
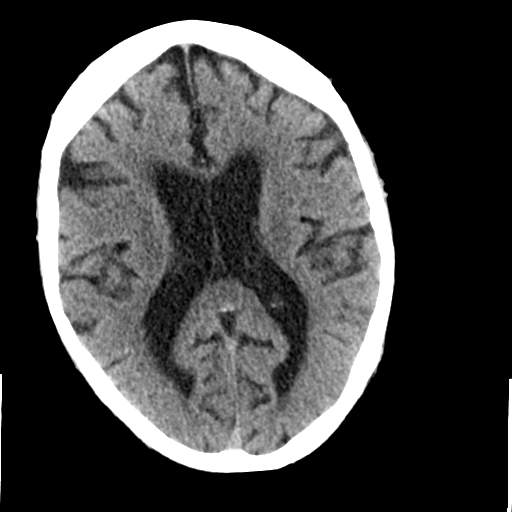
[im 18/34  bone]
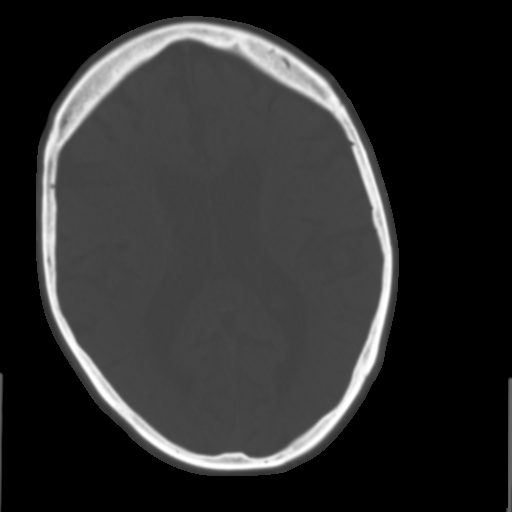
[im 20/34  brain]
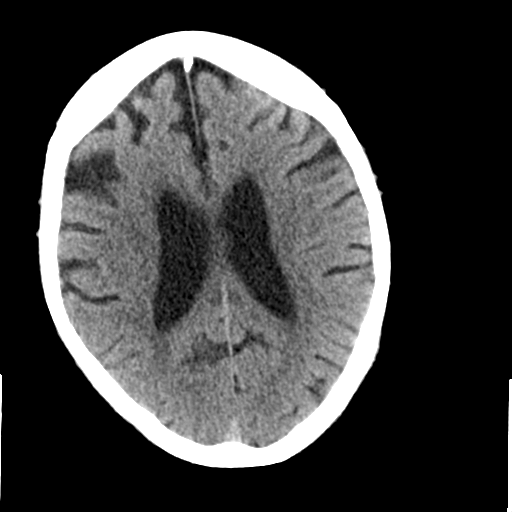
[im 22/34  brain]
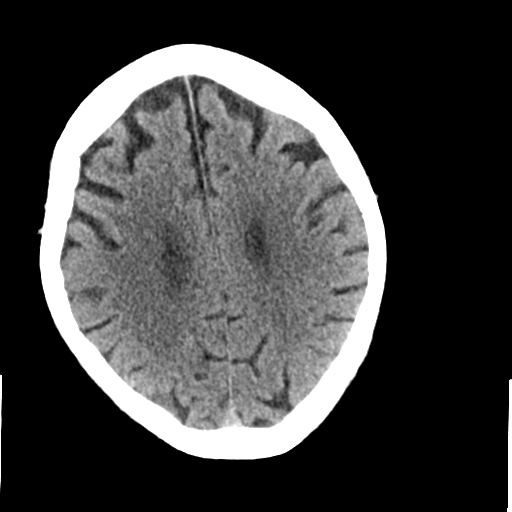
[im 24/34  brain]
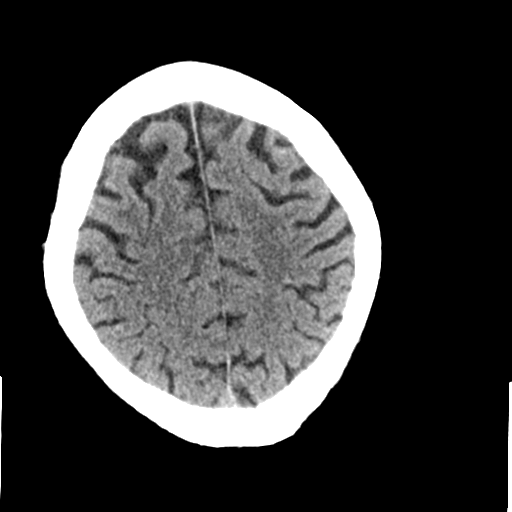
[im 26/34  brain]
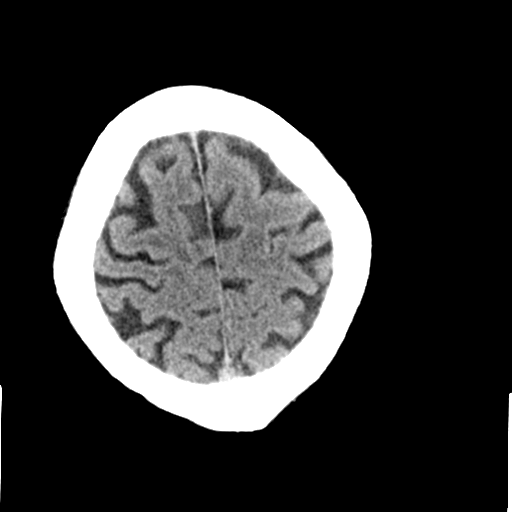
[im 26/34  bone]
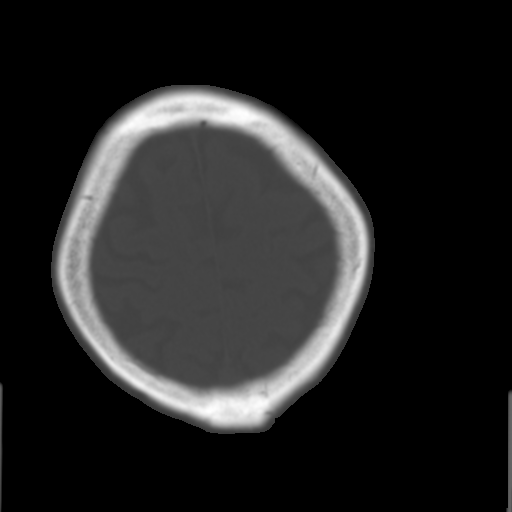
[im 28/34  brain]
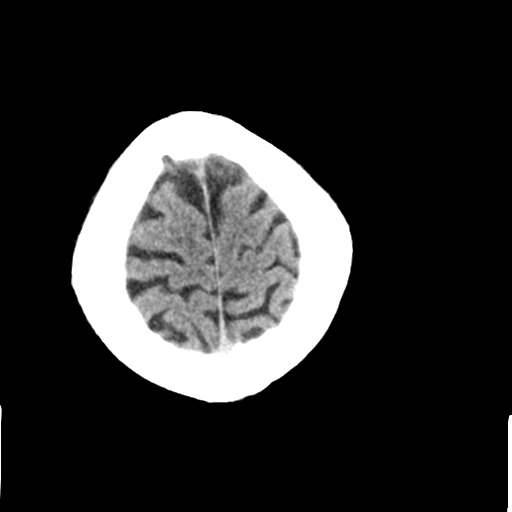
[im 30/34  brain]
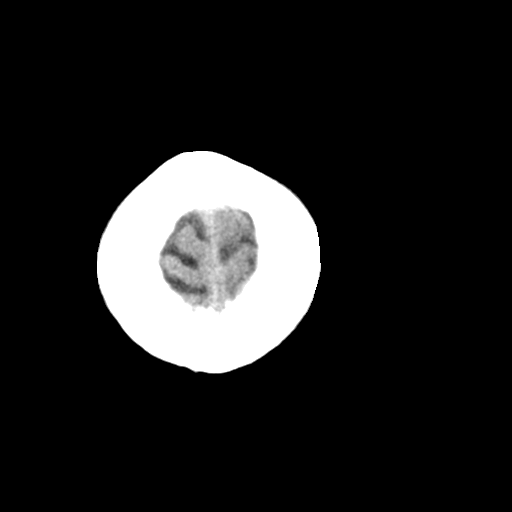
[im 32/34  brain]
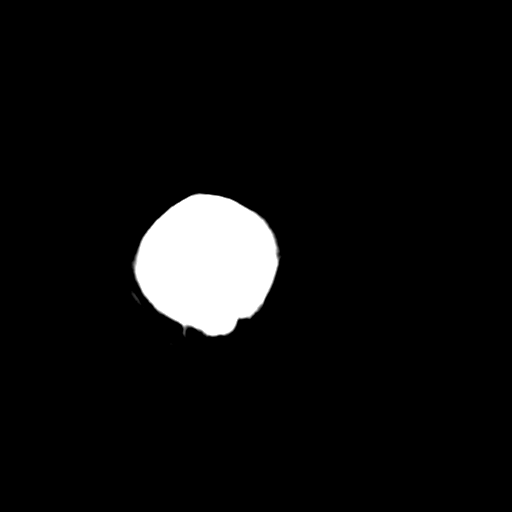

[16 of 30 positions shown; findings below may reference images not displayed]

FINDINGS: No evidence for acute infarction, hemorrhage, mass lesion,
hydrocephalus, or extra-axial fluid. There is generalized atrophy
with prominence of the ventricles, cisterns, and sulci. Moderate
white matter disease is observed. Vascular calcification is noted.

The calvarium is intact. There is no significant scalp hematoma.
Unremarkable orbits. Mild sphenoid sinus disease without visible
air-fluid level. LEFT turbinate concha bullosa. No mastoid fluid.
IMPRESSION: No acute intracranial abnormality. No skull fracture or visible
intracranial hemorrhage. Chronic changes as described similar to
prior MR.

## 2014-12-01 IMAGING — CT CT CERVICAL SPINE W/O CM
1 of 4 series · 4 of 33 positions shown, 5 images · non-contrast
Comparison: None.

CLINICAL DATA: Patient fell at home. Patient has altered mental
status, without unreliable history. Patient takes aspirin.

EXAM:
CT CERVICAL SPINE WITHOUT CONTRAST
TECHNIQUE: Multidetector CT imaging of the cervical spine was performed without
intravenous contrast. Multiplanar CT image reconstructions were also
generated.

[Series 208: orthogs · axial · 0.40mm/px · z∈[-53,+56]mm · 4 of 71 slices shown, 5 images]
[im 15/71  soft-tissue]
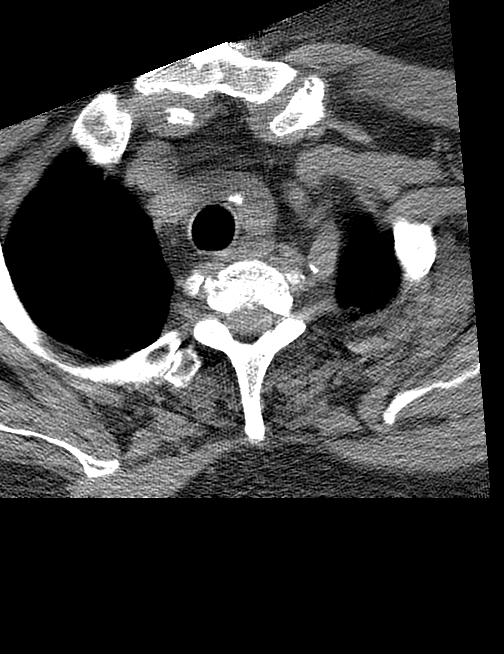
[im 15/71  bone]
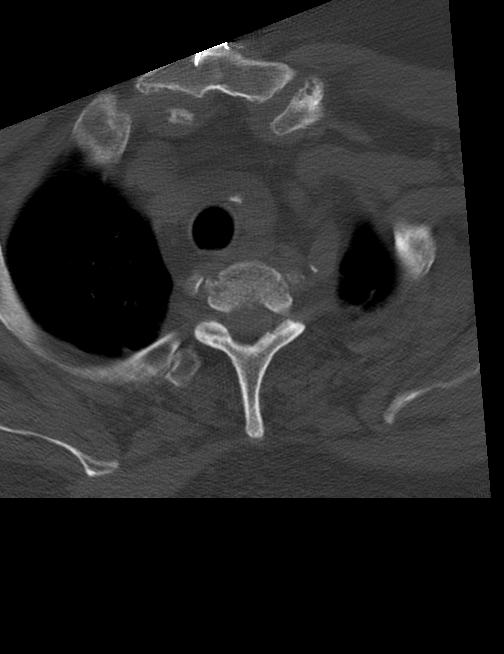
[im 29/71  bone]
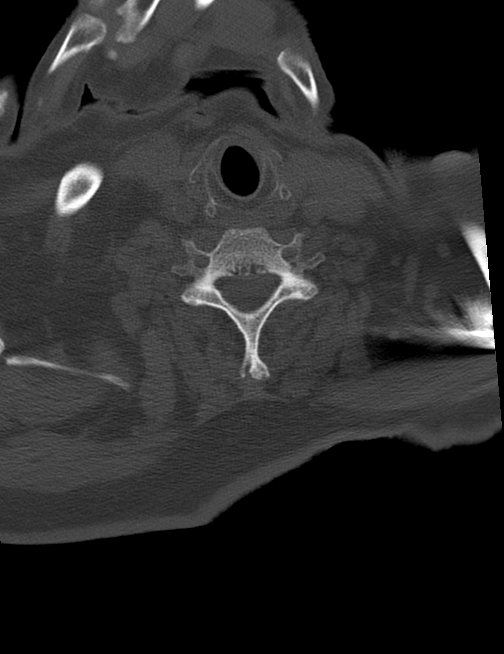
[im 43/71  bone]
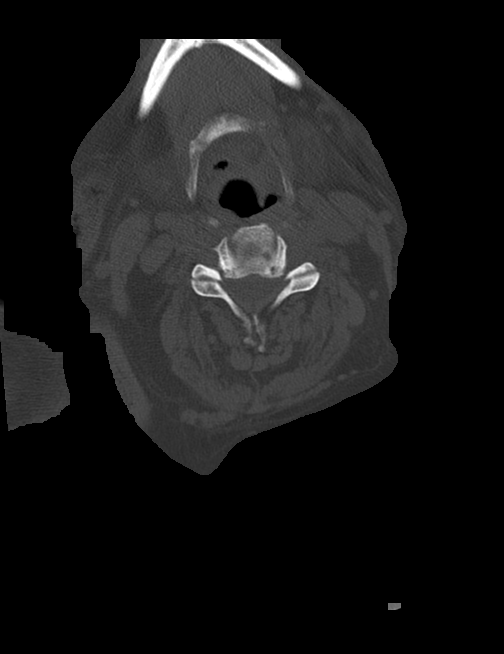
[im 57/71  bone]
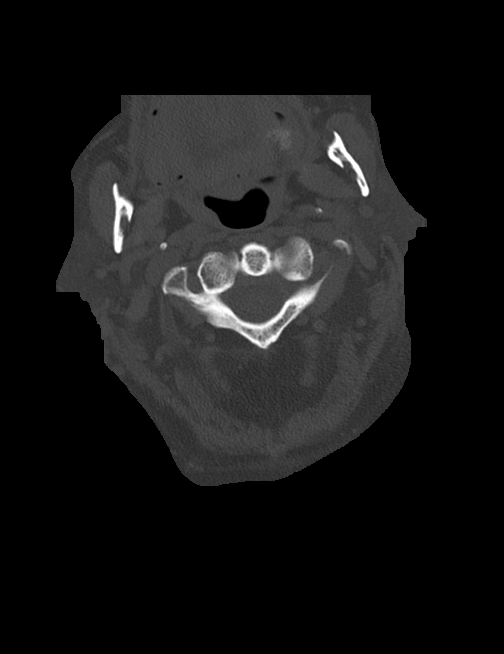

[4 of 33 positions shown; findings below may reference images not displayed]

FINDINGS: No fracture or spondylolisthesis.

Mild loss of disc height at C3-C4. Moderate loss of disc height at
C4-C5. Mild loss disc height at C5-C6. Endplate and uncovertebral
spurring noted from C3-C4 through C5-C6. There is neural foraminal
narrowing at these levels, moderate to severe on the right at C5-C6
and severe on the left at C4-C5 and C5-C6. Central stenosis is
noted, mild severity, at both C4-C5 and C5-C6.

Bones are demineralized. Soft tissues are unremarkable. Lung apices
are clear.
IMPRESSION: 1. No fracture or acute finding.
